# Patient Record
Sex: Male | Born: 1955 | Race: White | Hispanic: No | Marital: Married
Health system: Midwestern US, Academic
[De-identification: ages and names within clinical notes are randomized; demographics above are authoritative.]

---

## 2016-11-09 ENCOUNTER — Encounter: Admit: 2016-11-09 | Discharge: 2016-11-09 | Payer: BC Managed Care – PPO

## 2016-11-10 MED ORDER — METOPROLOL SUCCINATE 100 MG PO TB24
ORAL_TABLET | Freq: Every day | ORAL | 11 refills | 90.00000 days | Status: AC
Start: 2016-11-10 — End: 2017-11-05

## 2016-11-11 ENCOUNTER — Encounter: Admit: 2016-11-11 | Discharge: 2016-11-11 | Payer: BC Managed Care – PPO

## 2016-11-11 MED ORDER — ROSUVASTATIN 10 MG PO TAB
10 mg | ORAL_TABLET | Freq: Every day | ORAL | 1 refills | 90.00000 days | Status: AC
Start: 2016-11-11 — End: 2017-08-11

## 2016-11-11 NOTE — Progress Notes
Dear Dr. Marlyne Beards,     Delice Lesch (DOB October 22, 2055) is also followed by cardiologist, Dr. Bobette Mo.    For continuity of care please fax:    Most recent lipid and liver profile, chemistry panel, and thyroid panel.    Please fax results to:     [facility] Cardiovascular Medicine Department     [place] office fax: [phone]      Thank you,     Sue Lush, RN  Please call [phone] with any questions or concerns.

## 2016-11-11 NOTE — Telephone Encounter
Received a call from [place] at pt's pharmacy [phone]. Script was sent for Crestor 10 mg daily; however, pt reports taking it every other day and his fill history indicates that he is taking it every other day. I asked that they fill it at 10 mg daily and explained that pt will be getting labs. Once labs are received, we can review and determine appropriate dose.

## 2016-11-24 ENCOUNTER — Encounter: Admit: 2016-11-24 | Discharge: 2016-11-24 | Payer: BC Managed Care – PPO

## 2016-11-24 DIAGNOSIS — E7800 Pure hypercholesterolemia, unspecified: Principal | ICD-10-CM

## 2016-11-24 DIAGNOSIS — R002 Palpitations: ICD-10-CM

## 2017-01-07 ENCOUNTER — Encounter: Admit: 2017-01-07 | Discharge: 2017-01-07 | Payer: BC Managed Care – PPO

## 2017-01-07 MED ORDER — LOSARTAN 50 MG PO TAB
ORAL_TABLET | Freq: Every day | ORAL | 1 refills | 30.00000 days | Status: AC
Start: 2017-01-07 — End: 2017-05-13

## 2017-04-30 ENCOUNTER — Encounter: Admit: 2017-04-30 | Discharge: 2017-04-30 | Payer: BC Managed Care – PPO

## 2017-04-30 DIAGNOSIS — E7800 Pure hypercholesterolemia, unspecified: Principal | ICD-10-CM

## 2017-04-30 DIAGNOSIS — I471 Supraventricular tachycardia: ICD-10-CM

## 2017-05-08 LAB — LIPID PROFILE
Lab: 117
Lab: 166
Lab: 23
Lab: 4
Lab: 48
Lab: 95

## 2017-05-08 LAB — COMPREHENSIVE METABOLIC PANEL
Lab: 1.1 — ABNORMAL HIGH (ref 0.0–1.0)
Lab: 142
Lab: 15 — ABNORMAL HIGH (ref 0–14)
Lab: 22
Lab: 29
Lab: 3.9
Lab: 42
Lab: 6.9

## 2017-05-12 ENCOUNTER — Encounter: Admit: 2017-05-12 | Discharge: 2017-05-12 | Payer: BC Managed Care – PPO

## 2017-05-12 DIAGNOSIS — E7800 Pure hypercholesterolemia, unspecified: Principal | ICD-10-CM

## 2017-05-12 DIAGNOSIS — I471 Supraventricular tachycardia: ICD-10-CM

## 2017-05-13 ENCOUNTER — Ambulatory Visit: Admit: 2017-05-13 | Discharge: 2017-05-14 | Payer: BC Managed Care – PPO

## 2017-05-13 ENCOUNTER — Encounter: Admit: 2017-05-13 | Discharge: 2017-05-13 | Payer: BC Managed Care – PPO

## 2017-05-13 DIAGNOSIS — G4733 Obstructive sleep apnea (adult) (pediatric): ICD-10-CM

## 2017-05-13 DIAGNOSIS — I471 Supraventricular tachycardia: Principal | ICD-10-CM

## 2017-05-13 DIAGNOSIS — R739 Hyperglycemia, unspecified: ICD-10-CM

## 2017-05-13 DIAGNOSIS — E786 Lipoprotein deficiency: ICD-10-CM

## 2017-05-13 DIAGNOSIS — F410 Panic disorder [episodic paroxysmal anxiety] without agoraphobia: ICD-10-CM

## 2017-05-13 DIAGNOSIS — G4730 Sleep apnea, unspecified: ICD-10-CM

## 2017-05-13 DIAGNOSIS — R002 Palpitations: ICD-10-CM

## 2017-05-13 DIAGNOSIS — Z9109 Other allergy status, other than to drugs and biological substances: ICD-10-CM

## 2017-05-13 DIAGNOSIS — E7800 Pure hypercholesterolemia, unspecified: ICD-10-CM

## 2017-05-13 DIAGNOSIS — Z0189 Encounter for other specified special examinations: ICD-10-CM

## 2017-05-13 DIAGNOSIS — E119 Type 2 diabetes mellitus without complications: ICD-10-CM

## 2017-05-13 MED ORDER — LOSARTAN 100 MG PO TAB
100 mg | ORAL_TABLET | Freq: Every day | ORAL | 3 refills | 30.00000 days | Status: AC
Start: 2017-05-13 — End: 2018-05-27

## 2017-06-04 ENCOUNTER — Encounter: Admit: 2017-06-04 | Discharge: 2017-06-04 | Payer: BC Managed Care – PPO

## 2017-06-18 ENCOUNTER — Encounter: Admit: 2017-06-18 | Discharge: 2017-06-18 | Payer: BC Managed Care – PPO

## 2017-06-18 DIAGNOSIS — Z9109 Other allergy status, other than to drugs and biological substances: ICD-10-CM

## 2017-06-18 DIAGNOSIS — E119 Type 2 diabetes mellitus without complications: ICD-10-CM

## 2017-06-18 DIAGNOSIS — Z0189 Encounter for other specified special examinations: ICD-10-CM

## 2017-06-18 DIAGNOSIS — F410 Panic disorder [episodic paroxysmal anxiety] without agoraphobia: ICD-10-CM

## 2017-06-18 DIAGNOSIS — G4730 Sleep apnea, unspecified: ICD-10-CM

## 2017-06-18 DIAGNOSIS — R739 Hyperglycemia, unspecified: ICD-10-CM

## 2017-06-18 DIAGNOSIS — I471 Supraventricular tachycardia: Principal | ICD-10-CM

## 2017-06-18 DIAGNOSIS — R002 Palpitations: ICD-10-CM

## 2017-06-18 DIAGNOSIS — E786 Lipoprotein deficiency: ICD-10-CM

## 2017-08-11 ENCOUNTER — Encounter: Admit: 2017-08-11 | Discharge: 2017-08-11 | Payer: BC Managed Care – PPO

## 2017-08-11 MED ORDER — ROSUVASTATIN 10 MG PO TAB
10 mg | ORAL_TABLET | Freq: Every day | ORAL | 3 refills | 90.00000 days | Status: AC
Start: 2017-08-11 — End: 2018-08-31

## 2017-09-14 ENCOUNTER — Ambulatory Visit: Admit: 2017-09-14 | Discharge: 2017-09-15 | Payer: BC Managed Care – PPO

## 2017-09-14 ENCOUNTER — Encounter: Admit: 2017-09-14 | Discharge: 2017-09-14 | Payer: BC Managed Care – PPO

## 2017-09-14 DIAGNOSIS — G4730 Sleep apnea, unspecified: ICD-10-CM

## 2017-09-14 DIAGNOSIS — E7800 Pure hypercholesterolemia, unspecified: Principal | ICD-10-CM

## 2017-09-14 DIAGNOSIS — E119 Type 2 diabetes mellitus without complications: ICD-10-CM

## 2017-09-14 DIAGNOSIS — R739 Hyperglycemia, unspecified: ICD-10-CM

## 2017-09-14 DIAGNOSIS — Z0189 Encounter for other specified special examinations: ICD-10-CM

## 2017-09-14 DIAGNOSIS — I471 Supraventricular tachycardia: ICD-10-CM

## 2017-09-14 DIAGNOSIS — J0100 Acute maxillary sinusitis, unspecified: ICD-10-CM

## 2017-09-14 DIAGNOSIS — E786 Lipoprotein deficiency: ICD-10-CM

## 2017-09-14 DIAGNOSIS — R002 Palpitations: ICD-10-CM

## 2017-09-14 DIAGNOSIS — F410 Panic disorder [episodic paroxysmal anxiety] without agoraphobia: ICD-10-CM

## 2017-09-14 DIAGNOSIS — Z9109 Other allergy status, other than to drugs and biological substances: ICD-10-CM

## 2017-09-14 MED ORDER — AZITHROMYCIN 250 MG PO TAB
ORAL_TABLET | Freq: Every day | ORAL | 0 refills | Status: AC
Start: 2017-09-14 — End: ?

## 2017-09-15 ENCOUNTER — Encounter: Admit: 2017-09-15 | Discharge: 2017-09-15 | Payer: BC Managed Care – PPO

## 2017-11-05 ENCOUNTER — Encounter: Admit: 2017-11-05 | Discharge: 2017-11-05 | Payer: BC Managed Care – PPO

## 2017-11-05 MED ORDER — METOPROLOL SUCCINATE 100 MG PO TB24
ORAL_TABLET | Freq: Every day | ORAL | 11 refills | 90.00000 days | Status: AC
Start: 2017-11-05 — End: 2018-10-25

## 2018-03-30 ENCOUNTER — Encounter: Admit: 2018-03-30 | Discharge: 2018-03-30 | Payer: BC Managed Care – PPO

## 2018-03-30 DIAGNOSIS — E7800 Pure hypercholesterolemia, unspecified: Secondary | ICD-10-CM

## 2018-03-30 DIAGNOSIS — E786 Lipoprotein deficiency: Secondary | ICD-10-CM

## 2018-04-08 ENCOUNTER — Encounter: Admit: 2018-04-08 | Discharge: 2018-04-08 | Payer: BC Managed Care – PPO

## 2018-04-08 DIAGNOSIS — E786 Lipoprotein deficiency: Secondary | ICD-10-CM

## 2018-04-08 DIAGNOSIS — E7800 Pure hypercholesterolemia, unspecified: Secondary | ICD-10-CM

## 2018-04-08 LAB — CBC
Lab: 15
Lab: 31
Lab: 96
Lab: 12
Lab: 202
Lab: 30
Lab: 49
Lab: 5.1
Lab: 6.3

## 2018-04-08 LAB — LIPID PROFILE
Lab: 149 mg/dL — ABNORMAL LOW (ref 150–200)
Lab: 16 MMOL/L — ABNORMAL LOW (ref 21–30)
Lab: 3 (ref 3–12)
Lab: 53 U/L — ABNORMAL HIGH (ref 25–110)
Lab: 78 g/dL — ABNORMAL HIGH (ref 98–107)
Lab: 85 U/L (ref 7–40)

## 2018-04-08 LAB — COMPREHENSIVE METABOLIC PANEL
Lab: 1.4 mL/min — ABNORMAL HIGH (ref 0.0–1.0)
Lab: 144 g/dL — ABNORMAL LOW (ref 6.0–8.0)
Lab: 7 mL/min (ref >60)

## 2018-04-12 ENCOUNTER — Ambulatory Visit: Admit: 2018-04-12 | Discharge: 2018-04-13 | Payer: BC Managed Care – PPO

## 2018-04-12 ENCOUNTER — Encounter: Admit: 2018-04-12 | Discharge: 2018-04-12 | Payer: BC Managed Care – PPO

## 2018-04-12 DIAGNOSIS — Z9109 Other allergy status, other than to drugs and biological substances: Secondary | ICD-10-CM

## 2018-04-12 DIAGNOSIS — G4730 Sleep apnea, unspecified: Secondary | ICD-10-CM

## 2018-04-12 DIAGNOSIS — E786 Lipoprotein deficiency: Secondary | ICD-10-CM

## 2018-04-12 DIAGNOSIS — F410 Panic disorder [episodic paroxysmal anxiety] without agoraphobia: Secondary | ICD-10-CM

## 2018-04-12 DIAGNOSIS — R739 Hyperglycemia, unspecified: Secondary | ICD-10-CM

## 2018-04-12 DIAGNOSIS — I471 Supraventricular tachycardia: Secondary | ICD-10-CM

## 2018-04-12 DIAGNOSIS — E119 Type 2 diabetes mellitus without complications: Secondary | ICD-10-CM

## 2018-04-12 DIAGNOSIS — Z0189 Encounter for other specified special examinations: Secondary | ICD-10-CM

## 2018-04-12 DIAGNOSIS — E7800 Pure hypercholesterolemia, unspecified: Secondary | ICD-10-CM

## 2018-04-12 DIAGNOSIS — R002 Palpitations: Secondary | ICD-10-CM

## 2018-04-12 DIAGNOSIS — G4733 Obstructive sleep apnea (adult) (pediatric): Secondary | ICD-10-CM

## 2018-04-18 ENCOUNTER — Encounter: Admit: 2018-04-18 | Discharge: 2018-04-18 | Payer: BC Managed Care – PPO

## 2018-04-18 DIAGNOSIS — G4730 Sleep apnea, unspecified: ICD-10-CM

## 2018-04-18 DIAGNOSIS — E119 Type 2 diabetes mellitus without complications: ICD-10-CM

## 2018-04-18 DIAGNOSIS — R002 Palpitations: ICD-10-CM

## 2018-04-18 DIAGNOSIS — F410 Panic disorder [episodic paroxysmal anxiety] without agoraphobia: ICD-10-CM

## 2018-04-18 DIAGNOSIS — E786 Lipoprotein deficiency: ICD-10-CM

## 2018-04-18 DIAGNOSIS — Z0189 Encounter for other specified special examinations: ICD-10-CM

## 2018-04-18 DIAGNOSIS — I471 Supraventricular tachycardia: Principal | ICD-10-CM

## 2018-04-18 DIAGNOSIS — Z9109 Other allergy status, other than to drugs and biological substances: ICD-10-CM

## 2018-04-18 DIAGNOSIS — R739 Hyperglycemia, unspecified: ICD-10-CM

## 2018-05-27 ENCOUNTER — Encounter: Admit: 2018-05-27 | Discharge: 2018-05-27 | Payer: BC Managed Care – PPO

## 2018-05-27 MED ORDER — LOSARTAN 100 MG PO TAB
100 mg | ORAL_TABLET | Freq: Every day | ORAL | 3 refills | 90.00000 days | Status: AC
Start: 2018-05-27 — End: 2019-05-25

## 2018-08-31 ENCOUNTER — Encounter: Admit: 2018-08-31 | Discharge: 2018-08-31

## 2018-08-31 MED ORDER — ROSUVASTATIN 10 MG PO TAB
ORAL_TABLET | Freq: Every day | ORAL | 2 refills | 90.00000 days | Status: DC
Start: 2018-08-31 — End: 2019-05-25

## 2018-08-31 NOTE — Telephone Encounter
Received a request via computer from the patients pharmacy requesting a refill. Script e-scribed as requested. 

## 2018-09-02 ENCOUNTER — Encounter: Admit: 2018-09-02 | Discharge: 2018-09-02

## 2018-09-02 NOTE — Telephone Encounter
09/02/2018 11:25 AM   Call received from [facility] wanting coding for [facility] ordered 04/08/18. Code E78.00 given.  Wilder Glade, LPN

## 2018-10-08 ENCOUNTER — Encounter: Admit: 2018-10-08 | Discharge: 2018-10-08

## 2018-10-08 NOTE — Telephone Encounter
Pt called and requested to see Dr. Tollie Pizza next week.  He states that he was recently in the [facility] ED with atrial flutter.  He states that this has happened before when he has been dehydrated, but would like to see Dr. Tollie Pizza as they increased his metoprolol dosing, and he wants to get checked out.      Scheduled pt with Dr. Tollie Pizza on 7/29 in the [place] office.  Pt confirmed appointment time and location.    Will request records from [facility].

## 2018-10-08 NOTE — Progress Notes
Records Request    Medical records request for continuation of care:    Patient has appointment on 10/13/2018  with  Dr. Tollie Pizza .    Please fax records to [facility]  [phone]    Request records:      EKG's           Recent Cardiac Testing    Any cardiac-related records    Recent Labs    Recent ED visit notes    Thank you,      [facility]  [facility]  [address] [place] [address]  Phone:  [phone]  Fax:  [phone]

## 2018-10-11 ENCOUNTER — Encounter: Admit: 2018-10-11 | Discharge: 2018-10-11

## 2018-10-11 DIAGNOSIS — I4892 Unspecified atrial flutter: Secondary | ICD-10-CM

## 2018-10-11 DIAGNOSIS — R002 Palpitations: Secondary | ICD-10-CM

## 2018-10-11 DIAGNOSIS — I471 Supraventricular tachycardia: Secondary | ICD-10-CM

## 2018-10-12 ENCOUNTER — Encounter: Admit: 2018-10-12 | Discharge: 2018-10-12

## 2018-10-13 ENCOUNTER — Encounter: Admit: 2018-10-13 | Discharge: 2018-10-13

## 2018-10-13 ENCOUNTER — Ambulatory Visit: Admit: 2018-10-13 | Discharge: 2018-10-13

## 2018-10-13 DIAGNOSIS — I4892 Unspecified atrial flutter: Secondary | ICD-10-CM

## 2018-10-13 DIAGNOSIS — I471 Supraventricular tachycardia: Secondary | ICD-10-CM

## 2018-10-13 DIAGNOSIS — Z9109 Other allergy status, other than to drugs and biological substances: Secondary | ICD-10-CM

## 2018-10-13 DIAGNOSIS — R002 Palpitations: Principal | ICD-10-CM

## 2018-10-13 DIAGNOSIS — Z0189 Encounter for other specified special examinations: Secondary | ICD-10-CM

## 2018-10-13 DIAGNOSIS — F410 Panic disorder [episodic paroxysmal anxiety] without agoraphobia: Secondary | ICD-10-CM

## 2018-10-13 DIAGNOSIS — E7800 Pure hypercholesterolemia, unspecified: Secondary | ICD-10-CM

## 2018-10-13 DIAGNOSIS — G4730 Sleep apnea, unspecified: Secondary | ICD-10-CM

## 2018-10-13 DIAGNOSIS — R739 Hyperglycemia, unspecified: Secondary | ICD-10-CM

## 2018-10-13 DIAGNOSIS — E786 Lipoprotein deficiency: Secondary | ICD-10-CM

## 2018-10-13 DIAGNOSIS — E119 Type 2 diabetes mellitus without complications: Secondary | ICD-10-CM

## 2018-10-13 MED ORDER — PERFLUTREN LIPID MICROSPHERES 1.1 MG/ML IV SUSP
1-20 mL | Freq: Once | INTRAVENOUS | 0 refills | Status: CP | PRN
Start: 2018-10-13 — End: ?
  Administered 2018-10-13: 15:00:00 2 mL via INTRAVENOUS

## 2018-10-13 MED ORDER — FLECAINIDE 50 MG PO TAB
50 mg | ORAL_TABLET | Freq: Two times a day (BID) | ORAL | 3 refills | 30.00000 days | Status: DC
Start: 2018-10-13 — End: 2019-08-25

## 2018-10-13 NOTE — Progress Notes
Date of Service: 10/13/2018    Craig Krueger is a 63 y.o. male.       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  He recently experienced 2 episodes of atrial flutter treated at [facility] emergency department.    Craig Krueger has a history of AVNRT which is treated assess with ablation.  He has not had atrial flutter or fibrillation in the past.  The AVNRT occurred intermittently for years and finally failed medical therapy before.  He was always reluctant to have the ablation but was quite pleased after all of his symptoms resolved.  We now have a new problem.  On July 12 he works very hard outside during the day came in a bit dehydrated.  He had something called to drink after supper and then developed palpitations and tachycardia.  He went to the emergency room at [facility] for an EKG reveals atrial flutter with variable block.  He was given intravenous diltiazem and converted to sinus rhythm.  He was discharged without a change in medicines.  He had a repeat episode on July 23 that was identical to the first episode.  Again converted with diltiazem he was discharged home.  The ER doctor asked him to increase his metoprolol succinate from 100 mg to 150 mg daily.  He has had no recurrences.  Both episodes occurred when he had been working very hard in the heat outside, probably did not enough to drink, and then came in.  One occurred when he drank 3 beers.  I do not think that the beer is the issue, I think that this was triggered by drinking something very cold when he was dehydrated and had been very hot.     I reviewed EKGs.  Both demonstrate atrial flutter with variable block and the ventricular rate is 110 120 bpm. We obtain an echocardiogram today to ensure that been no change in his cardiac function the last 6 years.  Ejection fraction is normal, chamber sizes are normal, valves and Doppler normal, there were no wall motion maladies.  We know from previous investigation that he does not have coronary disease.    His examinations unremarkable this time.  He has not increased his dose of beta-blocker.    Impression  1.  Paroxysmal atrial flutter with rapid variable response.  He is currently on a beta-blocker for blood pressure and palpitations.  I think we would be better to treat him with an antiarrhythmic drug rather than increase beta-blockers in this situation.  He does not have coronary disease and his LV function is normal.  We will therefore start him on flecainide 50 mg twice daily.  I also instructed him that if he develops the tachycardia again he is to take a single 100 mg dose of flecainide once.  If this is not control the rhythm then he should call our office.    2.  Hypertension.  Controlled  3.  History of AVNRT corrected with ablation  4.  Possible sleep apnea  5.  History of PACs and PVCs    Plan  Start flecainide 50 g twice daily and I will follow-up with him in the office in a few months.  He was instructed in the pill in the pocket strategy.  He will take an  additional 100 mg of flecainide if he develops arrhythmia.  He does not do that more than once a day    This note was dictated using Dragon instant transcription system.  Please pardon any inadvertent errors.       Vitals:    10/13/18 1017   BP: 128/78   BP Source: Arm, Left Upper   Pulse: 65   Weight: 102.6 kg (226 lb 3.2 oz)   Height: 1.753 m (5' 9)   PainSc: Zero     Body mass index is 33.4 kg/m???.     Past Medical History  Patient Active Problem List    Diagnosis Date Noted   ??? Maxillary sinusitis 09/14/2017   ??? Hypercholesteremia 04/24/2011   ??? Elevated blood sugar level 06/13/2010   ??? Sleep apnea 04/20/2009 Mild no treatment necessary.  Sleep study - Occasional hypopneic episodes did not reach criteria for treatment. Suggested weight loss.     ??? Heart palpitations 04/20/2009   ??? SVT (supraventricular tachycardia) (HCC)      a.  09/1995 - RF ablation for AVNRT, successful.   b.  01/1999 - recurrent palpitations. Holter - rare PACs and PVCs that correlated with symptoms.          Metoprolol initiated with improved symptoms.        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  ECG - compatible with AVNRT   Improved with increase in beta blocker - also helps panic attacks.   c.  08/2014 RF ablation for AVNRT - Dr Craig Krueger successful      ??? Hypolipoproteinemia      A.  Pravastatin and lipitor caused myalgias, tolerated Crestor.      ??? Tests ordered      1997 - exercise echo negative.   04/08 - exercise echo. 11 minutes treadmill. EKG normal. Echo normal.   02/11 exercise Echo normal.  Ef normal, valves normal. Excellent ex tolerance.  No arrhythmias  2014 regaden thallium, EF 88%, LVEDP 42 mL, no ischemia.  02/16 exercise echo.  No ischemia, EF 65%, valves and Doppler normal.  09/18 Regaden thallium: EF 23%, LV 43 mL, no ischemia, no change from 2014     ??? Environmental allergies      A, Mold allergies.     ??? Panic attacks          Review of Systems   Constitution: Negative.   HENT: Negative.    Eyes: Negative.    Cardiovascular: Negative.    Respiratory: Negative.    Endocrine: Negative.    Hematologic/Lymphatic: Negative.    Skin: Negative.    Gastrointestinal: Negative.    Genitourinary: Negative.    Neurological: Negative.    Psychiatric/Behavioral: Negative.    Allergic/Immunologic: Negative.        Physical Exam   General Appearance:???no acute distress  Skin:???warm, moist, no ulcers  HEENT:???unremarkable  Neck Veins:???neck veins are flat, neck veins are not distended  Carotid Arteries:???normal carotid upstroke bilaterally, no bruits  Chest Inspection:???chest is normal in appearance Auscultation/Percussion:???lungs clear to auscultation, no rales, rhonchi, or wheezing  Cardiac Rhythm:???regular rhythm and normal rate  Cardiac Auscultation:???Normal S1 &???S2, no S3 or S4, no rub  Murmurs:???no cardiac murmurs   Extremities:???no lower extremity edema; 2+ symmetric distal pulses  Abdominal Exam:???soft, non-tender, no masses, bowel sounds normal  Liver & Spleen:???no organomegaly  Neurologic Exam:???neurological assessment grossly intact      Current Medications (including today's revisions)  ??? allopurinoL (ZYLOPRIM) 100 mg tablet Take 300 mg by  mouth daily.   ??? ALPRAZolam (XANAX) 0.5 mg tablet Take 0.5 mg by mouth three times daily as needed.   ??? FEXOFENADINE HCL (ALLEGRA PO) Take 1 Tab by mouth daily.   ??? flecainide (TAMBOCOR) 50 mg tablet Take one tablet by mouth twice daily. Indications: sudden recurrence/intensity of rapid regular heartbeats   ??? losartan (COZAAR) 100 mg tablet Take one tablet by mouth daily.   ??? metoprolol XL (TOPROL XL) 100 mg extended release tablet TAKE 1 TABLET BY MOUTH ONCE DAILY (Patient taking differently: 150 mg daily. Instructed to take 150mg  per last hospital visit.)   ??? rosuvastatin (CRESTOR) 10 mg tablet TAKE 1 TABLET BY MOUTH EVERY DAY (Patient taking differently: Take 10 mg by mouth every 48 hours.)

## 2018-10-14 ENCOUNTER — Encounter: Admit: 2018-10-14 | Discharge: 2018-10-14

## 2018-10-14 DIAGNOSIS — G4730 Sleep apnea, unspecified: Secondary | ICD-10-CM

## 2018-10-14 DIAGNOSIS — Z0189 Encounter for other specified special examinations: Secondary | ICD-10-CM

## 2018-10-14 DIAGNOSIS — E119 Type 2 diabetes mellitus without complications: Secondary | ICD-10-CM

## 2018-10-14 DIAGNOSIS — E786 Lipoprotein deficiency: Secondary | ICD-10-CM

## 2018-10-14 DIAGNOSIS — I471 Supraventricular tachycardia: Secondary | ICD-10-CM

## 2018-10-14 DIAGNOSIS — R739 Hyperglycemia, unspecified: Secondary | ICD-10-CM

## 2018-10-14 DIAGNOSIS — R002 Palpitations: Secondary | ICD-10-CM

## 2018-10-14 DIAGNOSIS — Z9109 Other allergy status, other than to drugs and biological substances: Secondary | ICD-10-CM

## 2018-10-14 DIAGNOSIS — F410 Panic disorder [episodic paroxysmal anxiety] without agoraphobia: Secondary | ICD-10-CM

## 2018-10-23 ENCOUNTER — Encounter: Admit: 2018-10-23 | Discharge: 2018-10-23

## 2018-10-25 MED ORDER — METOPROLOL SUCCINATE 100 MG PO TB24
ORAL_TABLET | Freq: Every day | ORAL | 10 refills | 90.00000 days | Status: DC
Start: 2018-10-25 — End: 2019-05-06

## 2018-12-20 NOTE — Progress Notes
The healthcare system obtained patient's verbal consent to treat them and their agreement to [facility] financial policy and NPP via this telehealth visit during the [facility] Emergency    Date of Service: 12/20/2018    Craig Krueger is a 63 y.o. male.       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    In 63 year old 2 episodes of atrial flutter.  He would like to proceed with ablation immediately.  We placed him on flecainide.  Since his rhythm has been controlled.    The patient was seen as a telehealth/telephone visit to the COVID-19 virus crisis.  During the pandemic we are attempting to decrease patient exposure by having tele-visits or telephone rather than in office visits with exposure to waiting room and having to leave the home.  We discussed  the COVID-19 virus pandemic precautions including social distancing, staying at home, avoiding contact with people when you have to go out, and other topics.    Craig Krueger is recently started on flecainide for a recurrence of arrhythmias.  He has history of AVNRT which was treated with ablation and is always had PACs and PVCs causing palpitations.  Recently he had a couple of episodes of atrial flutter.  We will start him on flecainide and with this his palpitations have completely resolved.  The longer episodes where he felt like his heart was racing have resolved.  His EKG does not show evidence of QT or icterus prolongation on the flecainide.  For now are going to continue with this dose.  We started 50 mg twice daily but is a pretty big man.  If he has a breakthrough is to take 100 mg as a pill in pocket strategy.  Today he is doing quite well.  He had no palpitations dizziness or lightheadedness.  He is working hard again.  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  He still has the occasional irregular beats.  Totaling these would not likely change.  He does not have shortness of breath or chest pain.    No examination due to telephone visit    Impression  1.  Paroxysmal atrial flutter.  Controlled with flecainide and beta-blocker  2.  Hypertension controlled  3.  History of AVNRT corrected with ablation  4.  Possible sleep apnea  5.  PACs and PVCs are benign  6.  No underlying coronary disease  7.  Hypercholesterolemia treated    We will continue with current therapy.  Plan on following up with him in 4 to 5 months at her previously scheduled appointment.       Total time 11 minutes.  Estimated counseling time 10 minutes.       Vitals:    12/20/18 1458   BP: 128/84   BP Source: Arm, Left Upper   Pulse: 76   Weight: 102.1 kg (225 lb)   Height: 1.753 m (5' 9)     Body mass index is 33.23 kg/m?Marland Kitchen     Past Medical History  Patient Active Problem List    Diagnosis Date Noted   ? Atrial flutter (HCC) 10/14/2018   ? Maxillary sinusitis 09/14/2017   ? Hypercholesteremia 04/24/2011   ? Elevated blood sugar level 06/13/2010   ? Sleep apnea 04/20/2009  Mild no treatment necessary.  Sleep study - Occasional hypopneic episodes did not reach criteria for treatment. Suggested weight loss.     ? Heart palpitations 04/20/2009   ? SVT (supraventricular tachycardia) (HCC)      a.  09/1995 - RF ablation for AVNRT, successful.   b.  01/1999 - recurrent palpitations. Holter - rare PACs and PVCs that correlated with symptoms.          Metoprolol initiated with improved symptoms.        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  ECG - compatible with AVNRT   Improved with increase in beta blocker - also helps panic attacks.   c.  08/2014 RF ablation for AVNRT - Dr Betti Cruz successful      ? Hypolipoproteinemia      A.  Pravastatin and lipitor caused myalgias, tolerated Crestor. ? Tests ordered      1997 - exercise echo negative.   04/08 - exercise echo. 11 minutes treadmill. EKG normal. Echo normal.   02/11 exercise Echo normal.  Ef normal, valves normal. Excellent ex tolerance.  No arrhythmias  2014 regaden thallium, EF 88%, LVEDP 42 mL, no ischemia.  02/16 exercise echo.  No ischemia, EF 65%, valves and Doppler normal.  09/18 Regaden thallium: EF 23%, LV 43 mL, no ischemia, no change from 2014     ? Environmental allergies      A, Mold allergies.     ? Panic attacks          Review of Systems   Constitution: Negative.   HENT: Negative.    Eyes: Negative.    Cardiovascular: Negative.    Respiratory: Negative.    Endocrine: Negative.    Hematologic/Lymphatic: Negative.    Skin: Negative.    Musculoskeletal: Positive for arthritis and back pain.   Gastrointestinal: Negative.    Genitourinary: Negative.    Neurological: Negative.    Psychiatric/Behavioral: Negative.    Allergic/Immunologic: Negative.        Physical Exam  Telephone visit.  No examination.      Current Medications (including today's revisions)  ? allopurinoL (ZYLOPRIM) 100 mg tablet Take 300 mg by mouth daily.   ? ALPRAZolam (XANAX) 0.5 mg tablet Take 0.5 mg by mouth three times daily as needed.   ? FEXOFENADINE HCL (ALLEGRA PO) Take 1 Tab by mouth daily.   ? flecainide (TAMBOCOR) 50 mg tablet Take one tablet by mouth twice daily. Indications: sudden recurrence/intensity of rapid regular heartbeats   ? losartan (COZAAR) 100 mg tablet Take one tablet by mouth daily.   ? metoprolol XL (TOPROL XL) 100 mg extended release tablet TAKE 1 TABLET BY MOUTH EVERY DAY   ? rosuvastatin (CRESTOR) 10 mg tablet TAKE 1 TABLET BY MOUTH EVERY DAY (Patient taking differently: Take 10 mg by mouth every 48 hours.)

## 2019-04-06 ENCOUNTER — Encounter

## 2019-04-06 DIAGNOSIS — E7800 Pure hypercholesterolemia, unspecified: Secondary | ICD-10-CM

## 2019-04-06 DIAGNOSIS — R002 Palpitations: Secondary | ICD-10-CM

## 2019-04-26 ENCOUNTER — Encounter: Admit: 2019-04-26 | Discharge: 2019-04-26 | Payer: BC Managed Care – PPO

## 2019-04-26 DIAGNOSIS — E7800 Pure hypercholesterolemia, unspecified: Secondary | ICD-10-CM

## 2019-04-26 DIAGNOSIS — R002 Palpitations: Secondary | ICD-10-CM

## 2019-04-26 LAB — LIPID PROFILE
Lab: 144
Lab: 17
Lab: 3
Lab: 76
Lab: 83
Lab: 51

## 2019-05-06 ENCOUNTER — Encounter: Admit: 2019-05-06 | Discharge: 2019-05-06 | Payer: BC Managed Care – PPO

## 2019-05-06 DIAGNOSIS — I10 Essential (primary) hypertension: Secondary | ICD-10-CM

## 2019-05-06 DIAGNOSIS — I4892 Unspecified atrial flutter: Secondary | ICD-10-CM

## 2019-05-06 DIAGNOSIS — I471 Supraventricular tachycardia: Secondary | ICD-10-CM

## 2019-05-06 DIAGNOSIS — E786 Lipoprotein deficiency: Secondary | ICD-10-CM

## 2019-05-06 DIAGNOSIS — E7800 Pure hypercholesterolemia, unspecified: Secondary | ICD-10-CM

## 2019-05-06 DIAGNOSIS — R002 Palpitations: Secondary | ICD-10-CM

## 2019-05-06 DIAGNOSIS — E119 Type 2 diabetes mellitus without complications: Secondary | ICD-10-CM

## 2019-05-06 DIAGNOSIS — R739 Hyperglycemia, unspecified: Secondary | ICD-10-CM

## 2019-05-06 DIAGNOSIS — F410 Panic disorder [episodic paroxysmal anxiety] without agoraphobia: Secondary | ICD-10-CM

## 2019-05-06 DIAGNOSIS — Z9109 Other allergy status, other than to drugs and biological substances: Secondary | ICD-10-CM

## 2019-05-06 DIAGNOSIS — Z0189 Encounter for other specified special examinations: Secondary | ICD-10-CM

## 2019-05-06 DIAGNOSIS — G4730 Sleep apnea, unspecified: Secondary | ICD-10-CM

## 2019-05-06 MED ORDER — METOPROLOL SUCCINATE 100 MG PO TB24
150 mg | ORAL_TABLET | Freq: Every day | ORAL | 3 refills | 90.00000 days | Status: AC
Start: 2019-05-06 — End: ?

## 2019-05-06 NOTE — Progress Notes
Date of Service: 05/06/2019    Craig Krueger is a 64 y.o. male.       HPI     Mr. Craig Krueger was seen in our office today for routine follow-up.  He is a 64 year old male followed in our office by Dr. Bobette Mo.  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  In 2020 a Holter showed two episodes of atrial flutter.  He was placed on flecainide.  Since, his rhythm has been controlled.  His last echo obtained 10/13/2018 showed normal left ventricular size and systolic function, EF 55 to 60%, normal RV size and systolic function, no significant valve disease.    The patient was last evaluated on 12/20/2018 by Dr. Doristine Counter with a scheduled telephone visit due to COVID-19 pandemic.  No changes were made in his medical therapy, continued on flecainide 50 mg twice daily.  Plans were made for him to follow back up in the office in 4 to 5 months.    Craig Krueger recently started flecainide for recurrence of arrhythmia.  He is always had PACs and PVCs causing palpitations.    Today the patient reports occasional skipped heartbeats occurring about 10 times a day most days occurring 1 beat at a time, sometimes 3 or 4 beats in a row but no sustained palpitations or racing heartbeats, these are not bothersome to him.  With the addition of flecainide his palpitations have improved significantly and his tachycardia has resolved.The longer episodes where he felt like his heart was racing have resolved.  His EKG today does not show evidence of QT or QTC prolongation on the flecainide.  He is doing very well.  He does not feel any decrease in energy or activity tolerance since last visit.  He has had no dizziness or lightheadedness.  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  He denies having chest pain, exertional chest pain, shortness of breath, exertional dyspnea, PND, orthopnea and lower extremity edema.  He denies myalgias.  He did not bring any blood pressure readings in today but tells me his morning readings are usually around 142 over 90s and lower in the evenings around 117/78.  In our office today his blood pressure is 130/88 and 134/96 in the right and left arms respectively, pulse 75 bpm.  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 We discussed  the COVID-19 virus pandemic precautions including social distancing, staying at home, avoiding contact with people when you have to go out, and other related topics.  He has had his first COVID-19 vaccine.    Cardiovascular Studies  12 lead EKG today preliminary interpretation shows normal sinus rhythm at 70 bpm, borderline left axis deviation.  PR interval 200 ms, QRS 88 ms, QT 392 ms, QTC 423 ms.  In comparison to the prior EKG dated 04/12/2018 there are no significant interval changes seen.    Assessment and Plan:  1.  Paroxysmal atrial flutter.  Normal ejection fraction.  No recurrence of palpitations or arrhythmias on the flecainide.  Going to continue with current dose of flecainide 50 mg twice daily.  If he has breakthrough he is to take an 50 mg pill in the pocket strategy.  2.  Hypertension.   Blood  pressure is borderline and in our office today.  Home readings do not appear to be fully controlled.  He reports home morning readings in the 140s systolic.  3.  History of AVNRT treated with successful ablation.  4.  Possible sleep apnea.  5.  PACs and PVCs that are benign.  6.  No underlying coronary artery disease.  7.  Hypercholesterolemia, treated.  Recent lipid profile on 04/26/2019 shows excellent control on rosuvastatin 10 mg daily with total cholesterol 144, triglycerides 83, LDL 76.    -Increase metoprolol XL to 150 mg daily, take at bedtime.  ?If patient has recurrent tachycardia he may take an extra dose of flecainide pill in the pocket 50 mg.  -If he does have recurrent tachycardia has to take an extra dose of flecainide he is instructed to contact our office to let us know.  -Asked patient to check his blood pressure twice daily and contact our office if he is often having systolic readings above 140 or diastolic above 90.  ?Reviewed and discussed blood pressure goals, target less than 135/85.  ?Instructed to call our office in 2 to 3 weeks with an update on his blood pressure readings.  -Discussed importance of a healthy lifestyle including low-fat, low triglyceride/low carbohydrate/low sugar, low-sodium diet and regular exercise.  -Encouraged to start a regular walking program, try to get in 30 minutes of exercise 5 days a week.  ?Discussed importance of continuing with COVID-19 pandemic precautions.  He will be getting his second vaccine soon.    Follow-up: Follow-up with Dr. Doristine Counter in 1 year, sooner if needed.  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  Patient is encouraged to contact our office if he has problems prior to next visit    Patient was educated on the plan of care.  Patient verbally expressed understanding and agreement with plan.  Specific instructions are outlined in the after visit summary document.     Thank you for the opportunity to participate in the care of your patient.  If you have any questions concern please do not sedate to contact us.        DISEASE PREVENTION  Lipids: Lipid profile 04/25/2018: Total cholesterol 144, triglycerides 83, HDL 51, LDL 76. Patient does not have  known CAD or diabetes. Goal LDL < 100, triglycerides < 150.      HTN: Blood pressure today 130/88.     Diabetes: No.      Tobacco: Denies use.      Obesity: Body mass index is 34.5.  Encouraged patient to work on reduction through diet and regular exercise.     Education/counseling today: Medication instructions, reviewed lab results, diet, exercise, blood pressure monitoring, blood pressure goals,follow up plan.            DRB  Vitals:    05/06/19 1415 05/06/19 1426   BP: (!) 134/96 130/88   BP Source: Arm, Left Upper Arm, Right Upper   Patient Position: Sitting Sitting   Pulse: 75    SpO2: 97%    Weight: 106.1 kg (234 lb)    Height: 1.753 m (5' 9)    PainSc: Zero      Body mass index is 34.56 kg/m?Marland Kitchen     Past Medical History  Patient Active Problem List    Diagnosis Date Noted   ? Essential hypertension 05/06/2019   ? Atrial flutter (HCC) 10/14/2018   ? Maxillary sinusitis 09/14/2017   ?  Hypercholesteremia 04/24/2011   ? Elevated blood sugar level 06/13/2010   ? Sleep apnea 04/20/2009     Mild no treatment necessary.  Sleep study - Occasional hypopneic episodes did not reach criteria for treatment. Suggested weight loss.     ? Heart palpitations 04/20/2009   ? SVT (supraventricular tachycardia) (HCC)      a.  09/1995 - RF ablation for AVNRT, successful.   b.  01/1999 - recurrent palpitations. Holter - rare PACs and PVCs that correlated with symptoms.          Metoprolol initiated with improved symptoms.        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  ECG - compatible with AVNRT   Improved with increase in beta blocker - also helps panic attacks.   c.  08/2014 RF ablation for AVNRT - Dr Betti Cruz successful      ? Hypolipoproteinemia      A.  Pravastatin and lipitor caused myalgias, tolerated Crestor.      ? Tests ordered      1997 - exercise echo negative.   04/08 - exercise echo. 11 minutes treadmill. EKG normal. Echo normal.   02/11 exercise Echo normal.  Ef normal, valves normal. Excellent ex tolerance.  No arrhythmias  2014 regaden thallium, EF 88%, LVEDP 42 mL, no ischemia.  02/16 exercise echo.  No ischemia, EF 65%, valves and Doppler normal.  09/18 Regaden thallium: EF 23%, LV 43 mL, no ischemia, no change from 2014     ? Environmental allergies      A, Mold allergies.     ? Panic attacks          Review of Systems   Constitution: Negative. HENT: Negative.    Eyes: Negative.    Cardiovascular: Positive for irregular heartbeat and palpitations.   Respiratory: Negative.    Endocrine: Negative.    Hematologic/Lymphatic: Negative.    Skin: Negative.    Musculoskeletal: Positive for stiffness.   Gastrointestinal: Negative.    Genitourinary: Negative.    Neurological: Negative.    Psychiatric/Behavioral: Negative.    Allergic/Immunologic: Negative.        Physical Exam  Vital signs were reviewed.   General Appearance:appears well nourished, obese, appears relaxed, in no acute distress,wearing a face mask  Skin: warm, moist, intact, no rash or lesions  HEENT: unremarkable, pupils equal and round, no scleral icterus  Lips & Mouth: no pallor or cyanosis  Neck Veins: neck veins are flat, neck veins are not distended   Carotid Arteries: normal carotid upstroke bilaterally, no bruits bilaterally  Chest Inspection: chest is normal in appearance  Auscultation/Percussion/Effort: lungs clear to auscultation, no rales, rhonchi, or wheezing, respirations even and unlabored, no respiratory distress  Cardiac Rhythm: regular rhythm and normal rate   Cardiac Auscultation: normal S1 & S2, no S3 or S4, no rub   Murmurs: no cardiac murmurs   Extremities: trace lower extremity edema bilaterally with sock top line, 2+ symmetric distal pulses   Abdominal Exam: soft, non-tender,non-distended, no obvious masses, bowel sounds normal, no guarding  Liver & Spleen: no organomegaly   Neurologic Exam: grossly intact, alert, moves all extremities equally   Orientation: oriented to time, person and place, clear historian  Gait: normal, steady, walks without assistance  Language & Memory: speech clear, patient responsive, seems to comprehend information              Problems Addressed Today  Encounter Diagnoses   Name Primary?   ? Atrial flutter, unspecified  type [facility]) Yes   ? Essential hypertension    ? Heart palpitations    ? Hypercholesteremia                    Current Medications (including today's revisions)  ? allopurinoL (ZYLOPRIM) 100 mg tablet Take 300 mg by mouth daily.   ? ALPRAZolam (XANAX) 0.5 mg tablet Take 0.5 mg by mouth three times daily as needed.   ? FEXOFENADINE HCL (ALLEGRA PO) Take 1 Tab by mouth daily.   ? flecainide (TAMBOCOR) 50 mg tablet Take one tablet by mouth twice daily. Indications: sudden recurrence/intensity of rapid regular heartbeats   ? losartan (COZAAR) 100 mg tablet Take one tablet by mouth daily.   ? metoprolol XL (TOPROL XL) 100 mg extended release tablet Take 1.5 tablets by mouth daily. Take at bedtime.   ? rosuvastatin (CRESTOR) 10 mg tablet TAKE 1 TABLET BY MOUTH EVERY DAY   ? sulindac (CLINORIL) 150 mg tablet Take 150 mg by mouth as Needed.

## 2019-05-25 ENCOUNTER — Encounter: Admit: 2019-05-25 | Discharge: 2019-05-25 | Payer: BC Managed Care – PPO

## 2019-05-25 MED ORDER — ROSUVASTATIN 10 MG PO TAB
ORAL_TABLET | Freq: Every day | ORAL | 3 refills | 90.00000 days | Status: AC
Start: 2019-05-25 — End: ?

## 2019-05-25 MED ORDER — LOSARTAN 100 MG PO TAB
100 mg | ORAL_TABLET | Freq: Every day | ORAL | 3 refills | 30.00000 days | Status: AC
Start: 2019-05-25 — End: ?

## 2019-08-25 ENCOUNTER — Encounter: Admit: 2019-08-25 | Discharge: 2019-08-25 | Payer: MEDICARE

## 2019-08-25 MED ORDER — FLECAINIDE 50 MG PO TAB
ORAL_TABLET | Freq: Two times a day (BID) | ORAL | 2 refills | 30.00000 days | Status: AC
Start: 2019-08-25 — End: ?

## 2020-04-03 ENCOUNTER — Encounter: Admit: 2020-04-03 | Discharge: 2020-04-03 | Payer: MEDICARE

## 2020-04-03 DIAGNOSIS — E7800 Pure hypercholesterolemia, unspecified: Secondary | ICD-10-CM

## 2020-04-03 LAB — COMPREHENSIVE METABOLIC PANEL
Lab: 1.1
Lab: 13
Lab: 141
Lab: 18
Lab: 21
Lab: 25
Lab: 4
Lab: 47
Lab: 6.9

## 2020-04-03 LAB — LIPID PROFILE
Lab: 114 — ABNORMAL HIGH (ref 98–107)
Lab: 153
Lab: 23
Lab: 3
Lab: 44

## 2020-04-12 ENCOUNTER — Encounter: Admit: 2020-04-12 | Discharge: 2020-04-12 | Payer: MEDICARE

## 2020-04-12 ENCOUNTER — Ambulatory Visit: Admit: 2020-04-12 | Discharge: 2020-04-12 | Payer: MEDICARE

## 2020-04-12 DIAGNOSIS — E119 Type 2 diabetes mellitus without complications: Secondary | ICD-10-CM

## 2020-04-12 DIAGNOSIS — E786 Lipoprotein deficiency: Secondary | ICD-10-CM

## 2020-04-12 DIAGNOSIS — F410 Panic disorder [episodic paroxysmal anxiety] without agoraphobia: Secondary | ICD-10-CM

## 2020-04-12 DIAGNOSIS — R002 Palpitations: Secondary | ICD-10-CM

## 2020-04-12 DIAGNOSIS — I10 Essential (primary) hypertension: Secondary | ICD-10-CM

## 2020-04-12 DIAGNOSIS — I471 Supraventricular tachycardia: Secondary | ICD-10-CM

## 2020-04-12 DIAGNOSIS — Z9109 Other allergy status, other than to drugs and biological substances: Secondary | ICD-10-CM

## 2020-04-12 DIAGNOSIS — I4892 Unspecified atrial flutter: Secondary | ICD-10-CM

## 2020-04-12 DIAGNOSIS — E7800 Pure hypercholesterolemia, unspecified: Secondary | ICD-10-CM

## 2020-04-12 DIAGNOSIS — G4730 Sleep apnea, unspecified: Secondary | ICD-10-CM

## 2020-04-12 DIAGNOSIS — Z0189 Encounter for other specified special examinations: Secondary | ICD-10-CM

## 2020-04-12 DIAGNOSIS — R739 Hyperglycemia, unspecified: Secondary | ICD-10-CM

## 2020-04-12 DIAGNOSIS — G4733 Obstructive sleep apnea (adult) (pediatric): Secondary | ICD-10-CM

## 2020-04-12 NOTE — Progress Notes
The Healthcare system obtained patient's verbal consent to treat them and their agreement to [facility] financial policy and NPP via this telehealth visit during the [facility]      Date of Service: 04/12/2020    Craig Krueger is a 65 y.o. male.       HPI     Craig Krueger is a delightful 65 year old gentleman followed through this office for AV node reentrant tachycardia with successful ablation in 2016, hypercholesterolemia, statin-induced myalgias, hypertension, and occasional palpitations due to benign PACs and PVCs which are rare.??  In 2020 he had 2 episodes of atrial flutter which have been controlled with flecainide.  He rarely uses a single dose additional pill for palpitations.       Craig Krueger is evaluated as a telephone call for routine follow-up.  He cannot get into the office closed him in a timely fashion so we use a telephone visit today.  He has no symptoms.  Still having chest pain, exertional chest pain, shortness of breath, exertional breath, PND orthopnea.  He has atrial flutter which is been treated medically with flecainide.  He had one breakthrough event with palpitations at night in November.  Per my previous instructions he took a single extra dose of flecainide and the rhythm stopped within 30 minutes.  Since it has been entirely free of palpitations or arrhythmia.  Is not had other symptoms.  Denies dizziness lightheadedness or near syncope.  We will continue with 50 mg twice daily flecainide and an extra 50 mg is a some pill in pocket strategy for breakthroughs.  As long as they happen less than once a year, he is well controlled.    He had questions about Covid, treatments for Covid, preventive treatments and the vaccine.  Unfortunately his family recently had Covid however they are all vaccinated and the illnesses are very minor.  He was able to keep working through it after the second day.    Telephone visit so no examination  ?  Impression  1.  Paroxysmal atrial flutter.  Controlled with flecainide and beta-blocker  2.  Hypertension controlled  3.  History of AVNRT corrected with ablation  4.  Possible sleep apnea  5.  PACs and PVCs are benign  6.  No underlying coronary disease  7.  Hypercholesterolemia treated    No change in therapy.  Follow-up annually now.        Total time 33 minutes.  Estimated counseling time 21 minutes.      Telehealth Patient Reported Vitals     Row Name 04/12/20 1132                BP: 130/82        BP Source: Arm, Left Upper        BP Position: Sitting        Pulse: 72        Weight: 104.3 kg (230 lb)        Height: 1.765 m (5' 9.5)        Pain Score: Four        Pain Location: KNEE                      Past Medical History  Patient Active Problem List    Diagnosis Date Noted   ? Essential hypertension 05/06/2019   ? Atrial flutter (HCC) 10/14/2018   ? Maxillary sinusitis 09/14/2017   ? Hypercholesteremia 04/24/2011   ? Elevated blood  sugar level 06/13/2010   ? Sleep apnea 04/20/2009     Mild no treatment necessary.  Sleep study - Occasional hypopneic episodes did not reach criteria for treatment. Suggested weight loss.     ? Heart palpitations 04/20/2009   ? SVT (supraventricular tachycardia) (HCC)      a.  09/1995 - RF ablation for AVNRT, successful.   b.  01/1999 - recurrent palpitations. Holter - rare PACs and PVCs that correlated with symptoms.          Metoprolol initiated with improved symptoms.        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  ECG - compatible with AVNRT   Improved with increase in beta blocker - also helps panic attacks.   c.  08/2014 RF ablation for AVNRT - Dr Betti Cruz successful      ? Hypolipoproteinemia      A.  Pravastatin and lipitor caused myalgias, tolerated Crestor.      ? Tests ordered      1997 - exercise echo negative.   04/08 - exercise echo. 11 minutes treadmill. EKG normal. Echo normal.   02/11 exercise Echo normal.  Ef normal, valves normal. Excellent ex tolerance.  No arrhythmias  2014 regaden thallium, EF 88%, LVEDP 42 mL, no ischemia.  02/16 exercise echo.  No ischemia, EF 65%, valves and Doppler normal.  09/18 Regaden thallium: EF 23%, LV 43 mL, no ischemia, no change from 2014     ? Environmental allergies      A, Mold allergies.     ? Panic attacks          Review of Systems   Constitutional: Negative.   HENT: Negative.    Eyes: Negative.    Cardiovascular: Negative.    Respiratory: Negative.    Endocrine: Negative.    Hematologic/Lymphatic: Negative.    Skin: Negative.    Musculoskeletal: Negative.    Gastrointestinal: Negative.    Genitourinary: Negative.    Neurological: Negative.    Psychiatric/Behavioral: Negative.    Allergic/Immunologic: Negative.        Physical Exam        Cardiovascular Health Factors  Vitals BP Readings from Last 3 Encounters:   05/06/19 130/88   12/20/18 128/84   10/13/18 (!) 154/88     Wt Readings from Last 3 Encounters:   05/06/19 106.1 kg (234 lb)   12/20/18 102.1 kg (225 lb)   10/13/18 102.6 kg (226 lb 3.2 oz)     BMI Readings from Last 3 Encounters:   05/06/19 34.56 kg/m?   12/20/18 33.23 kg/m?   10/13/18 33.40 kg/m?      Smoking Social History     Tobacco Use   Smoking Status Never Smoker   Smokeless Tobacco Current User   ? Types: Chew   Tobacco Comment    1/2 can/day      Lipid Profile Cholesterol   Date Value Ref Range Status   04/03/2020 153  Final     HDL   Date Value Ref Range Status   04/03/2020 44  Final     LDL   Date Value Ref Range Status   04/03/2020 86  Final     Triglycerides   Date Value Ref Range Status   04/03/2020 114  Final      Blood Sugar Hemoglobin A1C   Date Value Ref Range Status   05/02/2011 5.9  Final     Glucose   Date Value Ref Range Status   04/03/2020  109 (H) 70 - 105 Final   10/07/2018 99  Final   04/08/2018 110 (H) 70 - 105 Final   10/15/2005 90 70 - 110 MG/DL Final            Current Medications (including today's revisions)  ? allopurinoL (ZYLOPRIM) 100 mg tablet Take 300 mg by mouth daily.   ? ALPRAZolam (XANAX) 0.5 mg tablet Take 0.5 mg by mouth three times daily as needed.   ? FEXOFENADINE HCL (ALLEGRA PO) Take 1 Tab by mouth daily.   ? flecainide (TAMBOCOR) 50 mg tablet TAKE ONE TABLET BY MOUTH TWICE DAILY   ? losartan (COZAAR) 100 mg tablet TAKE ONE TABLET BY MOUTH DAILY.   ? metoprolol XL (TOPROL XL) 100 mg extended release tablet Take 1.5 tablets by mouth daily. Take at bedtime.   ? rosuvastatin (CRESTOR) 10 mg tablet TAKE 1 TABLET BY MOUTH EVERY DAY   ? sulindac (CLINORIL) 150 mg tablet Take 150 mg by mouth as Needed.

## 2020-04-18 ENCOUNTER — Encounter: Admit: 2020-04-18 | Discharge: 2020-04-18 | Payer: MEDICARE

## 2020-04-18 DIAGNOSIS — I10 Essential (primary) hypertension: Secondary | ICD-10-CM

## 2020-04-18 DIAGNOSIS — I4892 Unspecified atrial flutter: Secondary | ICD-10-CM

## 2020-04-18 MED ORDER — METOPROLOL SUCCINATE 100 MG PO TB24
150 mg | ORAL_TABLET | Freq: Every day | ORAL | 3 refills | 90.00000 days | Status: AC
Start: 2020-04-18 — End: ?

## 2020-05-04 IMAGING — CR CHEST
1 series · 1 of 1 positions shown · non-contrast
Comparison: none

PROCEDURE: CHEST
HISTORY: pt c/o palpitations - hx prev ablation tx tachycardia. hx htn - no prev CXR - Ak

[chest port x-wise]
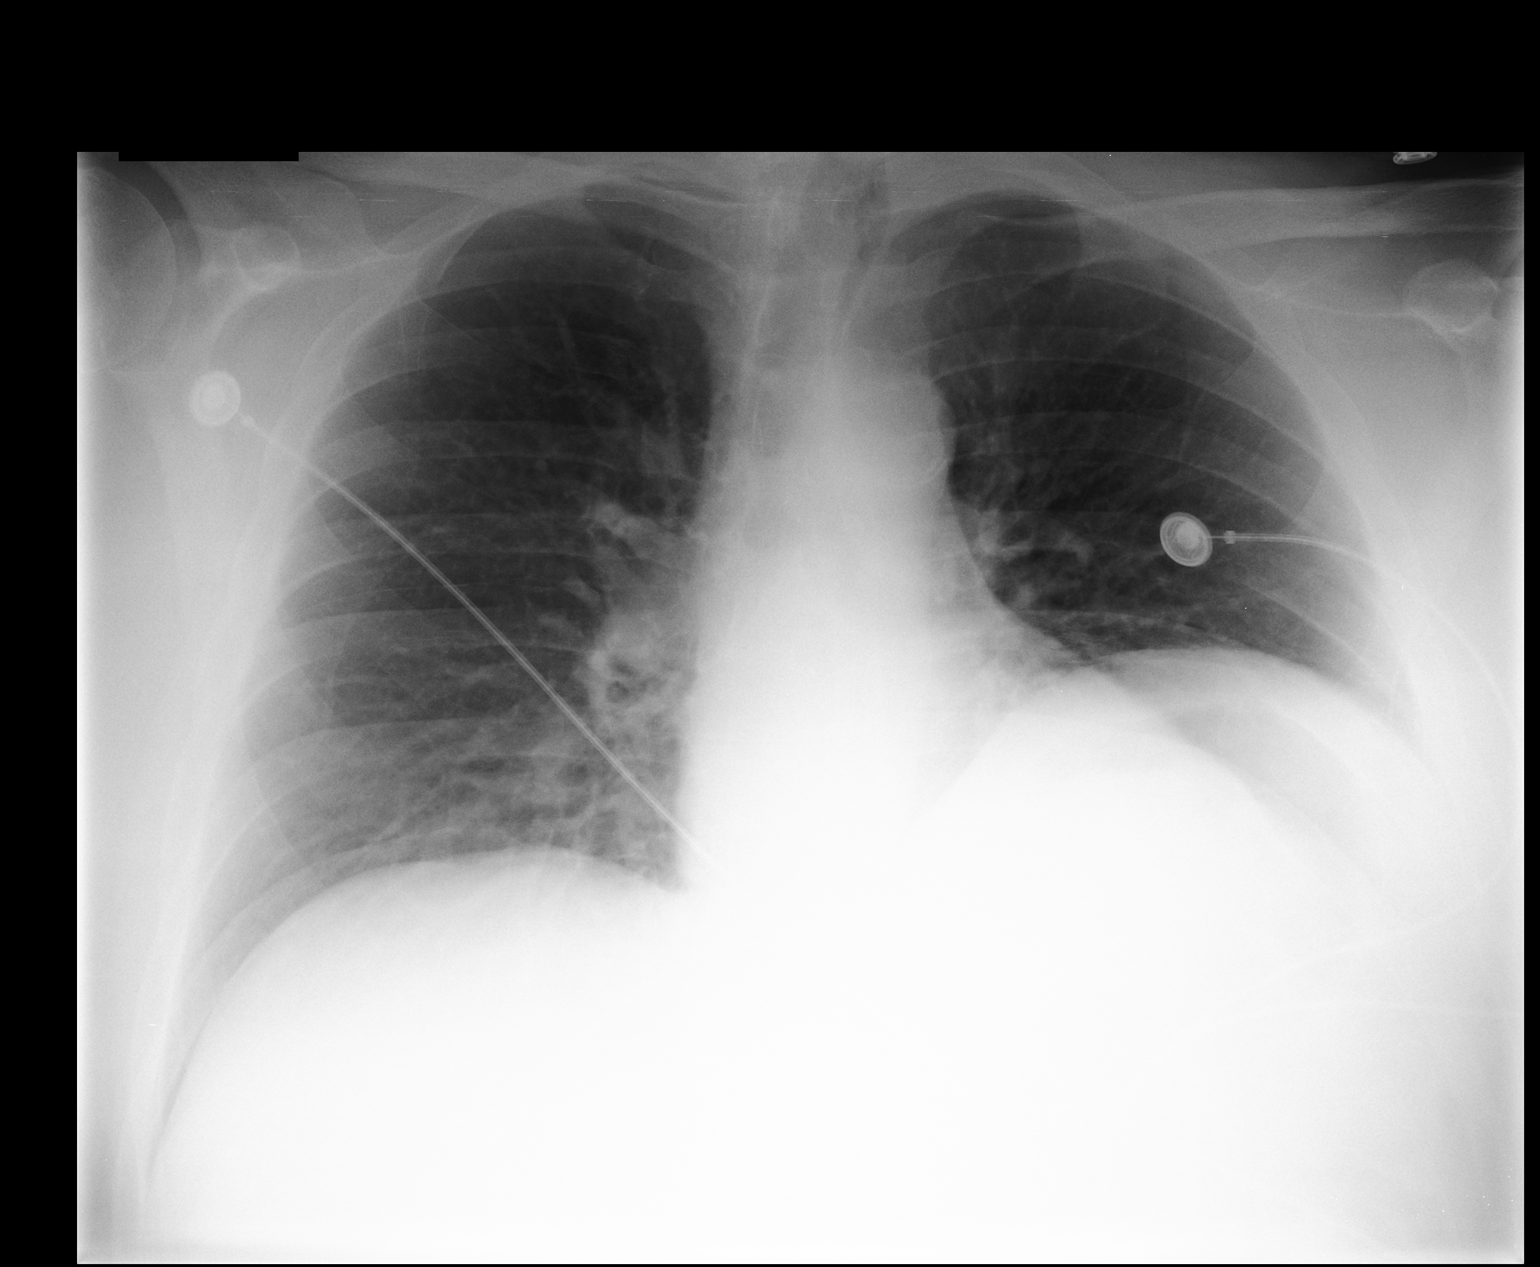

[1 of 1 positions shown; findings below may reference images not displayed]

FINDINGS: Single view of the chest without comparison shows streaky reticular airspace
opacities seen in the right lower lobe. There is no pleural effusion seen. The cardiac
silhouette is normal in size. There is no pulmonary vascular congestion seen. No
pneumothorax is seen. The visualized osseous structures show no acute findings.
IMPRESSION: 1. Streaky right lower lobe atelectasis versus infiltrate is seen, follow up recommended.
2. No pleural effusion.

Tech Notes:

[REDACTED]

## 2020-05-14 IMAGING — CR CHEST
1 series · 1 of 1 positions shown · non-contrast
Comparison: none

[chest port x-wise]
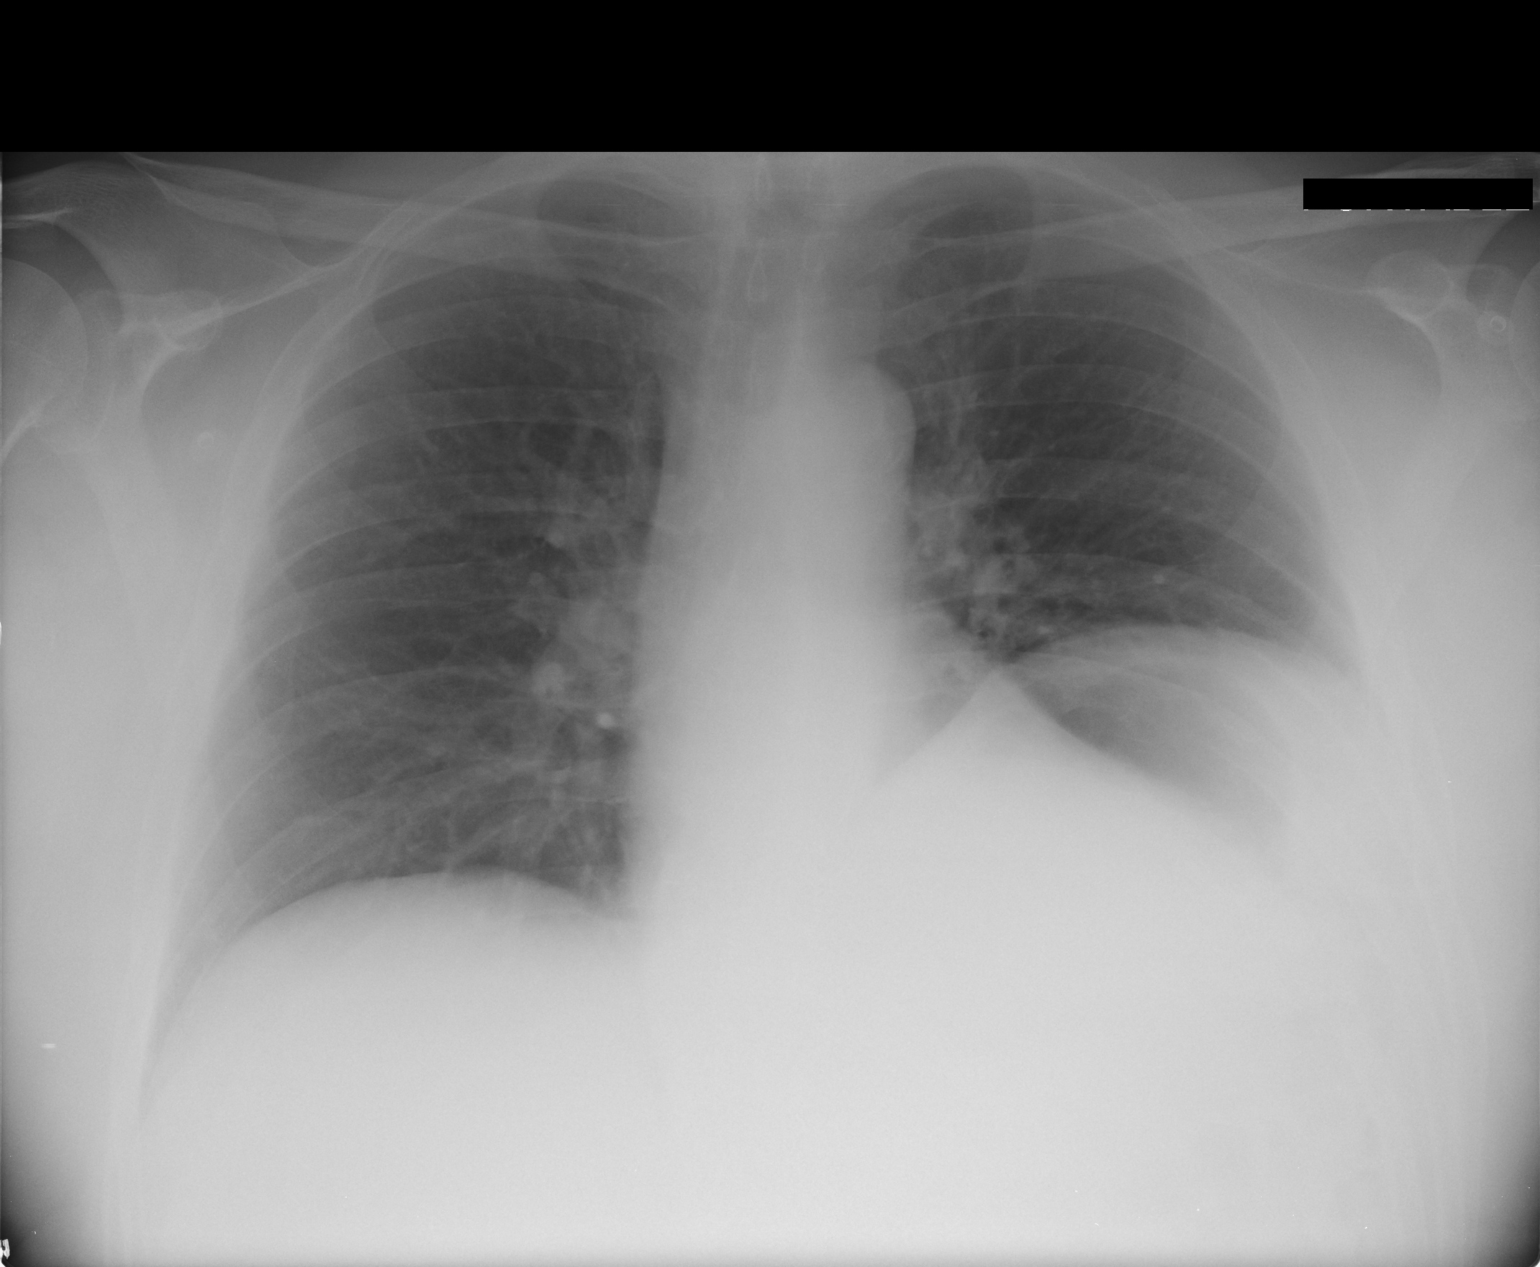

[1 of 1 positions shown; findings below may reference images not displayed]

EXAM

Portable chest x-ray.

INDICATION

atrial flutter
Pt c/o chest fluttering. Prior CXR 09/26/18. CC/CP

FINDINGS

The prior study was reviewed from 09/27/2018.

There is elevation left hemidiaphragm which appears unchanged.  There is gaseous distention of the
stomach.

There is mild cardiomegaly.

The lungs are otherwise clear.  The pulmonary vasculature is normal.

There are no acute bone lesions.

IMPRESSION

There is moderate elevation left hemidiaphragm which is unchanged.  This may be due to left phrenic
nerve dysfunction or eventration.  There is gaseous distention of the stomach.

There is mild cardiomegaly.  The lungs are clear.

Tech Notes:

Pt c/o chest fluttering. Prior CXR 09/26/18. CC/CP

## 2020-05-18 ENCOUNTER — Encounter: Admit: 2020-05-18 | Discharge: 2020-05-18 | Payer: MEDICARE

## 2020-05-18 MED ORDER — FLECAINIDE 50 MG PO TAB
50 mg | ORAL_TABLET | Freq: Two times a day (BID) | ORAL | 2 refills | 30.00000 days | Status: AC
Start: 2020-05-18 — End: ?

## 2020-05-18 MED ORDER — LOSARTAN 100 MG PO TAB
100 mg | ORAL_TABLET | Freq: Every day | ORAL | 3 refills | 30.00000 days | Status: AC
Start: 2020-05-18 — End: ?

## 2020-05-18 MED ORDER — ROSUVASTATIN 10 MG PO TAB
10 mg | ORAL_TABLET | Freq: Every day | ORAL | 3 refills | 90.00000 days | Status: AC
Start: 2020-05-18 — End: ?

## 2021-02-21 ENCOUNTER — Encounter: Admit: 2021-02-21 | Discharge: 2021-02-21 | Payer: MEDICARE

## 2021-02-21 MED ORDER — FLECAINIDE 50 MG PO TAB
50 mg | ORAL_TABLET | Freq: Two times a day (BID) | ORAL | 2 refills
Start: 2021-02-21 — End: ?

## 2021-04-23 ENCOUNTER — Encounter: Admit: 2021-04-23 | Discharge: 2021-04-23 | Payer: MEDICARE

## 2021-04-23 DIAGNOSIS — I4892 Unspecified atrial flutter: Secondary | ICD-10-CM

## 2021-04-23 DIAGNOSIS — I10 Essential (primary) hypertension: Secondary | ICD-10-CM

## 2021-04-23 MED ORDER — METOPROLOL SUCCINATE 100 MG PO TB24
ORAL_TABLET | Freq: Every evening | ORAL | 0 refills | 90.00000 days | Status: AC
Start: 2021-04-23 — End: ?

## 2021-05-23 ENCOUNTER — Encounter: Admit: 2021-05-23 | Discharge: 2021-05-23 | Payer: MEDICARE

## 2021-05-23 MED ORDER — ROSUVASTATIN 10 MG PO TAB
ORAL_TABLET | ORAL | 0 refills | 90.00000 days | Status: AC
Start: 2021-05-23 — End: ?

## 2021-05-23 MED ORDER — LOSARTAN 100 MG PO TAB
ORAL_TABLET | ORAL | 0 refills | 90.00000 days | Status: AC
Start: 2021-05-23 — End: ?

## 2021-07-02 ENCOUNTER — Ambulatory Visit: Admit: 2021-07-02 | Discharge: 2021-07-02 | Payer: MEDICARE

## 2021-07-02 ENCOUNTER — Encounter: Admit: 2021-07-02 | Discharge: 2021-07-02 | Payer: MEDICARE

## 2021-07-02 DIAGNOSIS — R739 Hyperglycemia, unspecified: Secondary | ICD-10-CM

## 2021-07-02 DIAGNOSIS — F410 Panic disorder [episodic paroxysmal anxiety] without agoraphobia: Secondary | ICD-10-CM

## 2021-07-02 DIAGNOSIS — I471 Supraventricular tachycardia: Secondary | ICD-10-CM

## 2021-07-02 DIAGNOSIS — Z9109 Other allergy status, other than to drugs and biological substances: Secondary | ICD-10-CM

## 2021-07-02 DIAGNOSIS — R002 Palpitations: Secondary | ICD-10-CM

## 2021-07-02 DIAGNOSIS — I10 Essential (primary) hypertension: Secondary | ICD-10-CM

## 2021-07-02 DIAGNOSIS — E119 Type 2 diabetes mellitus without complications: Secondary | ICD-10-CM

## 2021-07-02 DIAGNOSIS — E786 Lipoprotein deficiency: Secondary | ICD-10-CM

## 2021-07-02 DIAGNOSIS — Z0189 Encounter for other specified special examinations: Secondary | ICD-10-CM

## 2021-07-02 DIAGNOSIS — Z136 Encounter for screening for cardiovascular disorders: Secondary | ICD-10-CM

## 2021-07-02 DIAGNOSIS — G4730 Sleep apnea, unspecified: Secondary | ICD-10-CM

## 2021-07-02 DIAGNOSIS — I4892 Unspecified atrial flutter: Secondary | ICD-10-CM

## 2021-07-02 MED ORDER — METOPROLOL SUCCINATE 100 MG PO TB24
200 mg | ORAL_TABLET | Freq: Every evening | ORAL | 3 refills | 90.00000 days | Status: AC
Start: 2021-07-02 — End: ?

## 2021-07-02 NOTE — Progress Notes
Date of Service: 07/02/2021    Craig Krueger is a 66 y.o. male.       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  He rarely uses a single dose additional pill for palpitations.       Craig Krueger is back for routine follow-up.  He tells me his blood pressures been elevated quite a bit.  He brought numbers from home that show his blood pressure is staying to run nearly 100 at night sometimes.  He is currently on losartan 100 mg and metoprolol 150 mg nightly.  I think he would benefit from increasing his beta-blocker since his resting heart rates around 70-80 range and since still concerned about his arrhythmias.  He has not had any recurrence of palpitations or tachycardia since he had his AVNRT ablation.  He feels that that did very well.  He continues to take flecainide 50 mg twice daily with an extra dose as needed palpitations.  He has not had this for almost a year now.    He is not having chest pain, exertional chest pain, shortness of breath or shortness of breath PND orthopnea.  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  He has not had dizziness lightheadedness, syncope or near syncope.  My cardiovascular standpoint I think that he is doing well.    EKG demonstrates normal sinus rhythm and a normal EKG  Examination is normal.  Blood pressure is 132/86.  See details below    Impression  1. ?Paroxysmal atrial flutter. ?Controlled with flecainide and beta-blocker  2. ?Hypertension not fully controlled  We will increase metoprolol to 200 mg at bedtime.  3. ?History of AVNRT corrected with ablation  4. ?Possible sleep apnea  5. ?PACs and PVCs are benign  6. ?No underlying coronary disease  7. ?Hypercholesterolemia treated  ?  Blood pressure still not fully controlled.  Since his heart rate is still in the 70s will increase metoprolol to 1 mg bedtime continue with losartan and current dose of flecainide.  We will need to follow-up with a bit sooner than usual.  We will plan on having him follow-up in [place] office  ?       Vitals:    07/02/21 1557   BP: 132/86   BP Source: Arm, Left Upper   Pulse: 72   SpO2: 97%   PainSc: Zero   Weight: 107.5 kg (237 lb)   Height: 176.5 cm (5' 9.5)     Body mass index is 34.5 kg/m?Marland Kitchen     Past Medical History  Patient Active Problem List    Diagnosis Date Noted   ? Essential hypertension 05/06/2019   ? Atrial flutter (HCC) 10/14/2018   ? Maxillary sinusitis 09/14/2017   ? Hypercholesteremia 04/24/2011   ? Elevated blood sugar level 06/13/2010   ? Sleep apnea 04/20/2009     Mild no treatment necessary.  Sleep study - Occasional hypopneic episodes did not reach criteria for treatment. Suggested weight loss.     ? Heart palpitations 04/20/2009   ? SVT (supraventricular tachycardia) (HCC)      a.  09/1995 - RF ablation for AVNRT, successful.   b.  01/1999 - recurrent palpitations. Holter -  rare PACs and PVCs that correlated with symptoms.          Metoprolol initiated with improved symptoms.        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  ECG - compatible with AVNRT   Improved with increase in beta blocker - also helps panic attacks.   c.  08/2014 RF ablation for AVNRT - Dr Betti Cruz successful      ? Hypolipoproteinemia      A.  Pravastatin and lipitor caused myalgias, tolerated Crestor.      ? Tests ordered      1997 - exercise echo negative.   04/08 - exercise echo. 11 minutes treadmill. EKG normal. Echo normal.   02/11 exercise Echo normal.  Ef normal, valves normal. Excellent ex tolerance.  No arrhythmias  2014 regaden thallium, EF 88%, LVEDP 42 mL, no ischemia.  02/16 exercise echo.  No ischemia, EF 65%, valves and Doppler normal.  09/18 Regaden thallium: EF 23%, LV 43 mL, no ischemia, no change from 2014     ? Environmental allergies      A, Mold allergies.     ? Panic attacks          Review of Systems   Constitutional: Negative.   HENT: Negative.    Eyes: Negative.    Cardiovascular: Negative.    Respiratory: Negative.    Endocrine: Negative.    Hematologic/Lymphatic: Negative.    Skin: Negative.    Musculoskeletal: Negative.    Gastrointestinal: Negative.    Genitourinary: Negative.    Neurological: Negative.    Psychiatric/Behavioral: Negative.    Allergic/Immunologic: Negative.        Physical Exam   General Appearance: no acute distress  Skin: warm, moist, no ulcers  HEENT: unremarkable  Neck Veins: neck veins are flat, neck veins are not distended  Carotid Arteries: normal carotid upstroke bilaterally, no bruits  Chest Inspection: chest is normal in appearance  Auscultation/Percussion: lungs clear to auscultation, no rales, rhonchi, or wheezing  Cardiac Rhythm: regular rhythm and normal rate  Cardiac Auscultation: Normal S1 & S2, no S3 or S4, no rub  Murmurs: no cardiac murmurs   Extremities: no lower extremity edema; 2+ symmetric distal pulses  Abdominal Exam: soft, non-tender, no masses, bowel sounds normal  Liver & Spleen: no organomegaly  Neurologic Exam: neurological assessment grossly intact        Cardiovascular Health Factors  Vitals BP Readings from Last 3 Encounters:   07/02/21 132/86   05/06/19 130/88   12/20/18 128/84     Wt Readings from Last 3 Encounters:   07/02/21 107.5 kg (237 lb)   05/06/19 106.1 kg (234 lb)   12/20/18 102.1 kg (225 lb)     BMI Readings from Last 3 Encounters:   07/02/21 34.50 kg/m?   05/06/19 34.56 kg/m?   12/20/18 33.23 kg/m?      Smoking Social History     Tobacco Use   Smoking Status Never   Smokeless Tobacco Current   ? Types: Chew   Tobacco Comments    1/2 can/day      Lipid Profile Cholesterol   Date Value Ref Range Status   04/04/2021 158  Final     HDL   Date Value Ref Range Status   04/04/2021 60  Final LDL   Date Value Ref Range Status   04/04/2021 85  Final     Triglycerides   Date Value Ref Range Status   04/04/2021 67  Final  Blood Sugar Hemoglobin A1C   Date Value Ref Range Status   05/02/2011 5.9  Final     Glucose   Date Value Ref Range Status   04/04/2021 109 (H) 70 - 105 Final   04/03/2020 109 (H) 70 - 105 Final   10/07/2018 99  Final   10/15/2005 90 70 - 110 MG/DL Final          Current Medications (including today's revisions)  ? allopurinoL (ZYLOPRIM) 100 mg tablet Take three tablets by mouth daily.   ? ALPRAZolam (XANAX) 0.5 mg tablet Take one tablet by mouth three times daily as needed.   ? FEXOFENADINE HCL (ALLEGRA PO) Take 1 Tab by mouth daily.   ? flecainide (TAMBOCOR) 50 mg tablet TAKE ONE TABLET BY MOUTH TWICE DAILY   ? losartan (COZAAR) 100 mg tablet TAKE ONE TABLET BY MOUTH EVERY DAY   ? metoprolol succinate XL (TOPROL XL) 100 mg extended release tablet Take two tablets by mouth at bedtime daily.   ? rosuvastatin (CRESTOR) 10 mg tablet TAKE ONE TABLET BY MOUTH EVERY DAY   ? sulindac (CLINORIL) 150 mg tablet Take one tablet by mouth as Needed.

## 2021-08-17 ENCOUNTER — Encounter: Admit: 2021-08-17 | Discharge: 2021-08-17 | Payer: MEDICARE

## 2021-08-17 MED ORDER — ROSUVASTATIN 10 MG PO TAB
ORAL_TABLET | 0 refills
Start: 2021-08-17 — End: ?

## 2021-08-17 MED ORDER — LOSARTAN 100 MG PO TAB
ORAL_TABLET | 0 refills
Start: 2021-08-17 — End: ?

## 2021-11-14 ENCOUNTER — Encounter: Admit: 2021-11-14 | Discharge: 2021-11-14 | Payer: MEDICARE

## 2021-11-14 MED ORDER — FLECAINIDE 50 MG PO TAB
50 mg | ORAL_TABLET | Freq: Two times a day (BID) | ORAL | 2 refills | 30.00000 days | Status: AC
Start: 2021-11-14 — End: ?

## 2021-11-14 NOTE — Telephone Encounter
Received a request via computer from the patients pharmacy requesting a refill.  Script e-scribed as requested.

## 2022-01-29 ENCOUNTER — Encounter: Admit: 2022-01-29 | Discharge: 2022-01-29 | Payer: MEDICARE

## 2022-01-29 ENCOUNTER — Ambulatory Visit: Admit: 2022-01-29 | Discharge: 2022-01-30 | Payer: MEDICARE

## 2022-01-29 DIAGNOSIS — I471 SVT (supraventricular tachycardia): Secondary | ICD-10-CM

## 2022-01-29 DIAGNOSIS — G4730 Sleep apnea, unspecified: Secondary | ICD-10-CM

## 2022-01-29 DIAGNOSIS — E7800 Pure hypercholesterolemia, unspecified: Secondary | ICD-10-CM

## 2022-01-29 DIAGNOSIS — F410 Panic disorder [episodic paroxysmal anxiety] without agoraphobia: Secondary | ICD-10-CM

## 2022-01-29 DIAGNOSIS — Z9109 Other allergy status, other than to drugs and biological substances: Secondary | ICD-10-CM

## 2022-01-29 DIAGNOSIS — R002 Palpitations: Secondary | ICD-10-CM

## 2022-01-29 DIAGNOSIS — E119 Type 2 diabetes mellitus without complications: Secondary | ICD-10-CM

## 2022-01-29 DIAGNOSIS — E786 Lipoprotein deficiency: Secondary | ICD-10-CM

## 2022-01-29 DIAGNOSIS — I10 Essential (primary) hypertension: Secondary | ICD-10-CM

## 2022-01-29 DIAGNOSIS — R739 Hyperglycemia, unspecified: Secondary | ICD-10-CM

## 2022-01-29 DIAGNOSIS — Z0189 Encounter for other specified special examinations: Secondary | ICD-10-CM

## 2022-01-29 DIAGNOSIS — I4892 Unspecified atrial flutter: Secondary | ICD-10-CM

## 2022-01-29 NOTE — Progress Notes
Date of Service: 01/29/2022    Craig Krueger is a 66 y.o. male.          History of present illness: Craig Krueger is a very pleasant 66 y.o. male who is being seen at the [facility] Cardiovascular Medicine Department at the [facility] office.      Pertinent past medical history includes hypertension, dyslipidemia, previous statin induced myalgias, occasional palpitations due to isolated ectopy and previous AVNRT status post ablation in 2016 and likely atrial flutter.    He presents today in routine follow-up.  He is accompanied by his wife today.  He has no acute concerns or complaints today.  He notes he very rarely gets an occasional palpitation symptoms which he describes as a skipped beat sensation which only last for a few seconds at a time and happens at most a few times per month.  He does note he is typically limited by left knee pain, but otherwise he does not describe any exertional limitations such as chest discomfort, severe dyspnea, PND, orthopnea, syncope, dizziness, lightheadedness, lower extremity edema, TIA/CVA-like symptoms or other cardiovascular symptoms.  He is fully compliant with medical therapy.    Assessment:  ? Likely paroxysmal atrial flutter with no documented or symptomatic recurrence since 2020  ? History of AVNRT status post ablation in 2016  ? Hypertension  ? Rare but symptomatic premature atrial and ventricular contractions  ? Dyslipidemia  ? Relative intolerance to atorvastatin with myalgias.  ? Left knee arthritis    Plan:  We did discuss his atrial arrhythmia history.  He did have reported 1 episode of atrial flutter in 2020, with no symptomatic or documented recurrence.  He has been taking flecainide 50 mg twice daily and has been doing quite well from a symptom standpoint on this regimen.  Because his last ischemic evaluation was in 2016, we will need to ensure no underlying ischemia to ensure flecainide is safe to continue, therefore we will obtain a pharmacologic stress MPI.  With his knee pain, he will be unable to complete a treadmill study.  If this is nonischemic, it is reasonable to continue Toprol and flecainide.  We did discuss that his CHA2DS2-VASc score would technically be 2 for age and hypertension.  We will plan to obtain an routine ambulatory rhythm monitor, next with a 2-week Zio patch prior to his next appointment.  We may also need to consider placement of an ILR as we need to monitor him closely and he may require therapeutic anticoagulation for CVA risk reduction, for which he wishes to avoid at this time, therefore aspirin is reasonable for now.    His blood pressure looks optimal and we will continue losartan 100 mg daily and Toprol-XL 200 mg daily.    His lipid profile looks reasonable we will continue rosuvastatin 10 mg daily and he is tolerating this well.    We discussed ongoing lifestyle modifications.    Thank you for allowing [place] to care for this patient. If there are any questions or concerns, please don't hesitate to contact us.     We will see the patient back in return in in 9 month(s) time, or sooner if needed.     Alda Lea, DO, [facility]  Staff Cardiologist  Department of Cardiovascular Medicine  [facility]    Latest cardiovascular testing includes:  The 10-year ASCVD risk score (Arnett DK, et al., 2019) is: 12.1%*    Values used to calculate the  score:      Age: 76 years      Sex: Male      Is Non-Hispanic African American: No      Diabetic: No      Tobacco smoker: No      Systolic Blood Pressure: 120 mmHg      Is BP treated: Yes      HDL Cholesterol: 60 mg/dL*      Total Cholesterol: 158 mg/dL*      * - Cholesterol units were assumed for this score calculation    Most recent results for 12-Lead ECG   ECG 12-LEAD    Collection Time: 07/02/21  4:02 PM   Result Value Status    VENTRICULAR RATE 72 Final    P-R INTERVAL 196 Final    QRS DURATION 98 Final    Q-T INTERVAL 402 Final    QTC CALCULATION (BAZETT) 440 Final    P AXIS 36 Final    R AXIS -23 Final    T AXIS 51 Final    Impression    Normal sinus rhythm  Normal ECG  No previous ECGs available in MUSE  Confirmed by Doristine Counter, Dak ([id number]) on 07/02/2021 4:15:04 PM     Last Stress test: 05/15/2014:  Rest Echo:   Overall left ventricular systolic function is normal. EF~ 55%  No regional wall motion abnormalities identified   Valves appear structurally normal without signficant stenosis or regurgitation  No significant pericardial effusion  Normal ventricular chamber dimensions  Left atrial enlargement  Normal LV wall thickness  Normal diastolic function  Normal aortic root dimensions  Estimated peak systolic PA pressure =  30 mmHg   ??  Stress ECG  Normal rest baseline ECG  Fair exercise capacity  No symptoms of chest pain during exercise  Normal HR response to exercise  Hypertensive exercise response  ST segments remain isoelectric during stress  Several runs of non sustained  VT at peak exercise (3-6 beats)  Abnormal stress ECG with non sustained VT at peak exercise and hypertensive response  ?  Stress Echo:  EF improves with stress  LV function becomes hyperdynamic  LV volume decreases with stress.   No regional wall motion abnormalities post exercise.  ?  Exercise echocardiogram negative for ischemia.    Last echocardiogram: 10/13/2018:  1. Normal LV size and systolic function. LVEF 55-65%  2. Normal RV size and systolic function.   3. No hemodynamically significant valve disease.     Last Lipid profile and pertinent labs:   Lab Level  Lab Results   Component Value Date    CHOL 158 04/04/2021     Lab Results   Component Value Date    LDL 85 04/04/2021     No results found for: LIPOPROTA  Lab Results   Component Value Date    HDL 60 04/04/2021     No results found for: NONHDLCHOL  Lab Results   Component Value Date    TRIG 67 04/04/2021         Total time spent on today's office visit was >41 minutes including >50% of time time involved in counseling.  This includes face-to-face in person visit with patient as well as nonface-to-face time including review of the EMR, outside records, labs, radiologic studies, echocardiogram & other cardiovascular studies, formation of treatment plan, after visit summary, future disposition, and lastly on documentation.    Staff name:  Renato Shin, DO Date:  01/29/2022    Orders Placed This  Encounter   ? Zio Patch (7-14 days)   ? [facility]ADENOSON MPI STRESS TEST                  Vitals:    01/29/22 1340   BP: 120/76   BP Source: Arm, Right Upper   Pulse: 80   SpO2: 97%   O2 Device: None (Room air)   PainSc: Zero   Weight: 106.6 kg (235 lb)   Height: 176.5 cm (5' 9.5)     Body mass index is 34.21 kg/m?Marland Kitchen     Past Medical History  Patient Active Problem List    Diagnosis Date Noted   ? Essential hypertension 05/06/2019   ? Atrial flutter (HCC) 10/14/2018   ? Maxillary sinusitis 09/14/2017   ? Hypercholesteremia 04/24/2011   ? Elevated blood sugar level 06/13/2010   ? Sleep apnea 04/20/2009     Mild no treatment necessary.  Sleep study - Occasional hypopneic episodes did not reach criteria for treatment. Suggested weight loss.     ? Heart palpitations 04/20/2009   ? SVT (supraventricular tachycardia)      a.  09/1995 - RF ablation for AVNRT, successful.   b.  01/1999 - recurrent palpitations. Holter - rare PACs and PVCs that correlated with symptoms.          Metoprolol initiated with improved symptoms.        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  ECG - compatible with AVNRT   Improved with increase in beta blocker - also helps panic attacks.   c.  08/2014 RF ablation for AVNRT - Dr Betti Cruz successful      ? Hypolipoproteinemia      A.  Pravastatin and lipitor caused myalgias, tolerated Crestor.      ? Tests ordered      1997 - exercise echo negative.   04/08 - exercise echo. 11 minutes treadmill. EKG normal. Echo normal.   02/11 exercise Echo normal.  Ef normal, valves normal. Excellent ex tolerance.  No arrhythmias  2014 regaden thallium, EF 88%, LVEDP 42 mL, no ischemia.  02/16 exercise echo.  No ischemia, EF 65%, valves and Doppler normal.  09/18 Regaden thallium: EF 23%, LV 43 mL, no ischemia, no change from 2014     ? Environmental allergies      A, Mold allergies.     ? Panic attacks          Review of Systems   Constitutional: Negative.   HENT: Negative.    Eyes: Negative.    Cardiovascular: Negative.    Respiratory: Negative.    Endocrine: Negative.    Hematologic/Lymphatic: Negative.    Skin: Negative.    Musculoskeletal: Negative.    Gastrointestinal: Negative.    Genitourinary: Negative.    Neurological: Negative.    Psychiatric/Behavioral: Negative.    Allergic/Immunologic: Negative.        Physical Exam   Constitutional: He appears well-developed. No distress.   HENT:   Head: Normocephalic and atraumatic.   Mouth/Throat: Mucous membranes are moist.   Eyes: No scleral icterus.   Neck: No JVD present. Carotid bruit is not present.   Cardiovascular: Normal rate and regular rhythm.   No murmur heard.  Pulmonary/Chest: Effort normal and breath sounds normal. No respiratory distress. He has no wheezes.   Abdominal: Soft. Bowel sounds are normal. He exhibits no distension. There is no abdominal tenderness.   Musculoskeletal:      Right lower leg: No edema.  Left lower leg: No edema.   Neurological: He is alert and oriented to person, place, and time. Coordination normal.   Skin: Skin is warm and dry. He is not diaphoretic. No pallor.   Psychiatric: His behavior is normal.       Cardiovascular Health Factors  Vitals BP Readings from Last 3 Encounters:   01/29/22 120/76   07/02/21 132/86   05/06/19 130/88     Wt Readings from Last 3 Encounters:   01/29/22 106.6 kg (235 lb)   07/02/21 107.5 kg (237 lb)   05/06/19 106.1 kg (234 lb)     BMI Readings from Last 3 Encounters:   01/29/22 34.21 kg/m?   07/02/21 34.50 kg/m?   05/06/19 34.56 kg/m?      Smoking Social History     Tobacco Use   Smoking Status Never   Smokeless Tobacco Current   ? Types: Chew   Tobacco Comments    1/2 can/day      Lipid Profile Cholesterol   Date Value Ref Range Status   04/04/2021 158  Final     HDL   Date Value Ref Range Status   04/04/2021 60  Final     LDL   Date Value Ref Range Status   04/04/2021 85  Final     Triglycerides   Date Value Ref Range Status   04/04/2021 67  Final      Blood Sugar Hemoglobin A1C   Date Value Ref Range Status   05/02/2011 5.9  Final     Glucose   Date Value Ref Range Status   04/04/2021 109 (H) 70 - 105 Final   04/03/2020 109 (H) 70 - 105 Final   10/07/2018 99  Final   10/15/2005 90 70 - 110 MG/DL Final          Problems Addressed Today  Encounter Diagnoses   Name Primary?   ? Essential hypertension Yes   ? SVT (supraventricular tachycardia)    ? Hypercholesteremia    ? Atrial flutter, unspecified type (HCC)    ? Heart palpitations                    Current Medications (including today's revisions)  ? allopurinoL (ZYLOPRIM) 100 mg tablet Take three tablets by mouth daily.   ? ALPRAZolam (XANAX) 0.5 mg tablet Take one tablet by mouth three times daily as needed.   ? FEXOFENADINE HCL (ALLEGRA PO) Take 1 Tab by mouth daily.   ? flecainide (TAMBOCOR) 50 mg tablet TAKE ONE TABLET BY MOUTH TWICE DAILY   ? losartan (COZAAR) 100 mg tablet TAKE ONE TABLET BY MOUTH EVERY DAY   ? metoprolol succinate XL (TOPROL XL) 100 mg extended release tablet Take two tablets by mouth at bedtime daily.   ? rosuvastatin (CRESTOR) 10 mg tablet TAKE ONE TABLET BY MOUTH EVERY DAY   ? sulindac (CLINORIL) 150 mg tablet Take one tablet by mouth as Needed.

## 2022-01-29 NOTE — Patient Instructions
It was nice to see you in clinic today.    We discussed:  Your history of arrhythmias. For now it looks like your current medication regimen is controlling this well.   While on flecainide it is important to ensure blood flow to the heart is adequate.   We want to make sure you don't have other arrhythmias with periodic monitors as treatment can be different depending on what arrhythmia it is.   Your blood pressure and cholesterol looks well controlled.     I would like to make the following medication adjustments:  No Rx changes today.     Otherwise continue the same medications as you have been doing.    We will be pursuing the following tests after your appointment today:  Nuclear stress test (examines the blood flow to the heart muscle)  Heart rhythm monitor.   These tests can be done before your next visit.     I will plan to see you back in about 9 months.  Please call us in the meantime with any questions or concerns.    If there are any questions or concerns, our main line is [phone]. Our scheduling department can be reached at [phone].     [place] clinic: Call the [place] nursing line at [phone].  Leave a detailed message for the nurse in [place] with how we can assist you and we will call you back.     If you have not heard or been advised of MyChart results of your testing in more than 1 week after it has been performed, please give [place] a call so that we may investigate further.    Lifestyle Modification recommendations:  Please continue to take your prescribed medications at the scheduled times on a regular basis.    Regular aerobic and weight bearing exercise is recommended at least 5 days a week for at least 30 minutes with a goal to exercise for 60 minutes. Ideally, exercise should be done daily if possible.     Eat a heart healthy, mediterranean style diet, focusing on fresh fruit, vegetables, nuts, legumes and avoidance of red or processed meat.   Limit the salt in your diet to less than 2,400 mg daily and calories in your diet.   Avoid high saturated fat/high cholesterol foods.  Increase soluble fiber intake as tolerated.     Limit alcohol intake to 1 drink daily for women and 2 drinks daily for men (1 drink = 5 ounces of wine, 12 ounces of beer, or 1 ounce of liquor).    Try to maintain an ideal body weight.    Do not smoke and avoid environments where people are smoking.    The American Heart Association has a great website with lots of important patient information about heart disease and other medical conditions including: high blood pressure, cholesterol disorders, diabetes mellitus, and strokes.  I highly recommend you visit their site: www.heart.org    Follow up regularly with your primary care physician.    [facility]    Nuclear Stress Test Instructions    Your cardiologist has asked that you have a nuclear stress test (also known as a Myocardial Perfusion Imaging (MPI) test.    This evaluation of your heart muscle consists of two sets of nuclear images and either a Treadmill stress test or a chemical stress test, decided by you and your physician.    You will get an IV placed in your arm for  the test.    You will need to be able to raise your arm up by your head for about 20 minutes and lie on your back for about 10 minutes.  Please discuss this with your doctor or talk with the nuclear technologist or nurse if these are a problem for you.    It is recommended not to schedule any other appointment for the same day.      Wear comfortable clothing and walking shoes if you are walking on the treadmill.  Women should wear shorts or comfortable pants instead of dresses.   Sweatshirts or T-shirts work really well for imaging.    If you have a Zio Patch cardiac monitor in place, please reschedule your nuclear stress test after your monitoring period is complete. If you would like to proceed with the nuclear stress test during the monitoring time, the patch will have to be removed and the data will be lost; we can place a new patch following testing.       If you have a cardiac monitor with replacement patches, please inform the nurse prior to your appointment.  The monitor will need to be removed during testing. Please bring replacement patches with you so that we can resume monitoring following your test.     There may be enough time to leave to get a snack after the stress portion of the test.   The Technologist will tell you what time to return for the second set of images.    PLEASE NO CAFFEINE 24 HOURS PRIOR TO TEST:  Examples include coffee, tea, decaffeinated drinks, colas, [facility], Dr. Reino Kent.  Some orange sodas and root beers have caffeine, please check.  No energy drinks, Excedrin, Midol, or any foods CONTAINING CHOCOLATE.   Consuming Caffeine may postpone your test.    PLEASE DO NOT EAT OR DRINK THE MORNING OF YOUR TEST.  Water is ok to drink with your morning medications.    Please hold these medications the day of test:      DIABETIC PATIENTS:  if insulin dependent:  please take one third of your insulin with two pieces of dry toast and a small juice.  Bring remaining 2/3   insulin and oral diabetic medications with you to your test.    PLEASE NO TOBACCO PRODUCTS BETWEEN SCANS  You will NOT need a driver for this test.  But are welcome to bring a visitor with you.   Visitors will not be able to accompany you back to stress room.   Please do not bring children to the nuclear stress test.    TEST FINDINGS:   You will receive the results of the test within 7 business days of completion of this test by telephone. If you have any questions concerning your nuclear stress test or if you do not hear from your cardiologist/or nurse with 7 business days, please call our office.

## 2022-02-13 ENCOUNTER — Encounter: Admit: 2022-02-13 | Discharge: 2022-02-13 | Payer: MEDICARE

## 2022-02-13 MED ORDER — ROSUVASTATIN 10 MG PO TAB
ORAL_TABLET | ORAL | 1 refills | 90.00000 days | Status: AC
Start: 2022-02-13 — End: ?

## 2022-02-13 MED ORDER — LOSARTAN 100 MG PO TAB
ORAL_TABLET | ORAL | 1 refills | 90.00000 days | Status: AC
Start: 2022-02-13 — End: ?

## 2022-06-12 ENCOUNTER — Encounter: Admit: 2022-06-12 | Discharge: 2022-06-12 | Payer: MEDICARE

## 2022-06-12 DIAGNOSIS — I10 Essential (primary) hypertension: Secondary | ICD-10-CM

## 2022-06-12 DIAGNOSIS — I4892 Unspecified atrial flutter: Secondary | ICD-10-CM

## 2022-06-12 MED ORDER — METOPROLOL SUCCINATE 100 MG PO TB24
ORAL_TABLET | ORAL | 3 refills | 90.00000 days | Status: AC
Start: 2022-06-12 — End: ?

## 2022-06-12 NOTE — Telephone Encounter
Received a request via computer from the patients pharmacy requesting a refill.  Script e-scribed as requested.

## 2022-06-18 ENCOUNTER — Ambulatory Visit: Admit: 2022-06-18 | Discharge: 2022-06-18 | Payer: MEDICARE

## 2022-06-18 ENCOUNTER — Encounter: Admit: 2022-06-18 | Discharge: 2022-06-18 | Payer: MEDICARE

## 2022-06-18 DIAGNOSIS — I471 SVT (supraventricular tachycardia) (HCC): Secondary | ICD-10-CM

## 2022-06-18 DIAGNOSIS — R002 Palpitations: Secondary | ICD-10-CM

## 2022-06-18 DIAGNOSIS — E7800 Pure hypercholesterolemia, unspecified: Secondary | ICD-10-CM

## 2022-06-18 DIAGNOSIS — I10 Essential (primary) hypertension: Secondary | ICD-10-CM

## 2022-06-18 DIAGNOSIS — I4892 Unspecified atrial flutter: Secondary | ICD-10-CM

## 2022-06-18 MED ORDER — RP DX TC-99M TETROFOSMIN MCI
28 | Freq: Once | INTRAVENOUS | 0 refills | Status: CP
Start: 2022-06-18 — End: ?

## 2022-06-18 MED ORDER — REGADENOSON 0.4 MG/5 ML IV SYRG
.4 mg | Freq: Once | INTRAVENOUS | 0 refills | Status: CP
Start: 2022-06-18 — End: ?

## 2022-06-18 MED ORDER — AMINOPHYLLINE 25 MG/ML IV SOLN
50 mg | INTRAVENOUS | 0 refills | Status: AC | PRN
Start: 2022-06-18 — End: ?

## 2022-06-18 MED ORDER — ALBUTEROL SULFATE 90 MCG/ACTUATION IN HFAA
2 | RESPIRATORY_TRACT | 0 refills | Status: AC | PRN
Start: 2022-06-18 — End: ?

## 2022-06-18 MED ORDER — SODIUM CHLORIDE 0.9 % IV SOLP
250 mL | INTRAVENOUS | 0 refills | Status: AC | PRN
Start: 2022-06-18 — End: ?

## 2022-06-18 MED ORDER — RP DX TC-99M TETROFOSMIN MCI
10 | Freq: Once | INTRAVENOUS | 0 refills | Status: CP
Start: 2022-06-18 — End: ?

## 2022-06-18 MED ORDER — EUCALYPTUS-MENTHOL MM LOZG
1 | Freq: Once | ORAL | 0 refills | Status: AC | PRN
Start: 2022-06-18 — End: ?

## 2022-06-18 MED ORDER — NITROGLYCERIN 0.4 MG SL SUBL
.4 mg | SUBLINGUAL | 0 refills | Status: AC | PRN
Start: 2022-06-18 — End: ?

## 2022-06-20 ENCOUNTER — Encounter: Admit: 2022-06-20 | Discharge: 2022-06-20 | Payer: MEDICARE

## 2022-06-20 NOTE — Telephone Encounter
Results and recommendations called to patient.

## 2022-06-20 NOTE — Telephone Encounter
-----   Message from Renato Shin, DO sent at 06/19/2022  5:22 PM CDT -----  Can we let the patient know that I reviewed his stress test.  Overall it was good news and reassuring.  The heart function is normal and no significant blockages or ischemia were noted.  He is safe to continue taking his current medication regimen.  No new changes today.

## 2022-07-30 ENCOUNTER — Encounter: Admit: 2022-07-30 | Discharge: 2022-07-30 | Payer: MEDICARE

## 2022-07-30 ENCOUNTER — Ambulatory Visit: Admit: 2022-07-30 | Discharge: 2022-07-30 | Payer: MEDICARE

## 2022-07-30 DIAGNOSIS — I10 Essential (primary) hypertension: Secondary | ICD-10-CM

## 2022-07-30 DIAGNOSIS — R002 Palpitations: Secondary | ICD-10-CM

## 2022-07-30 DIAGNOSIS — I471 SVT (supraventricular tachycardia) (HCC): Secondary | ICD-10-CM

## 2022-07-30 DIAGNOSIS — E7800 Pure hypercholesterolemia, unspecified: Secondary | ICD-10-CM

## 2022-07-30 DIAGNOSIS — I4892 Unspecified atrial flutter: Secondary | ICD-10-CM

## 2022-07-30 NOTE — Progress Notes
To our valued patient,     We have enrolled your heart monitor and requested it be sent to your home.  You should receive this within 2-3 business days. Please wear the monitor for 14 days. When you have completed the study, please remove the device, and mail it back to the company. Please call [employer] Customer Service at [phone] with questions about placement, troubleshooting, and insurance coverage. You can reach the [facility] ambulatory heart monitor team at [phone].      Your Heart Rhythm Management Team  Cardiovascular Medicine Department at [facility]              Ambulatory (External) Cardiac Monitor Enrollment Record     Placement Location: Home Enrollment  Clinic Location: [facility]  No data recordedNo data recordedNo data recordedMonitor Diagnosis: -- (I48.92)  Secondary Monitor Diagnosis: Palpitations (R00.2)  Ordering Provider: Renato Shin, DO  AMB Monitor Serial Number: home  No data recorded    Start Time and Date: 07/30/22 7:10 AM   Patient Name: Craig Krueger  DOB: [birth year]  MRN: [id number]  Sex: male  Mobile Phone Number: [phone] (mobile)  Home Phone Number: [phone]  Patient Address: [address] [place] [address]  Insurance Coverage: MEDICARE PART A AND B  Insurance ID: [id number]  Insurance Group #:   Insurance Subscriber: [name],[name]  Implanted Cardiac Device Information: No results found for: [id number]      Patient instructed to contact company phone number on the monitor box with questions regarding billing, placement, troubleshooting.     [employer] [employer] Elih Mooney    ____________________________________________________________    Clinic Staff:    Complete additional steps for documentation double check/Co-Sign.  In Follow-up, send chart upon closing encounter to [facility] AMBULATORY MONITORS    [facility] Ambulatory Monitoring Team:  Schedule on appropriate template and check-in.   Clinic Placement Schedule on clinic location [facility] schedule   Home Enrollment Schedule on Home Enrollment schedule ([facility])   Given to patient in clinic for self-placement Schedule on Home Enrollment schedule ([facility])   Inpatient Schedule on [place] [facility] AMBULATORY MONITORING template   2. Please enroll with appropriate vendor.

## 2022-09-01 ENCOUNTER — Encounter: Admit: 2022-09-01 | Discharge: 2022-09-01 | Payer: MEDICARE

## 2022-09-01 NOTE — Telephone Encounter
-----   Message from Agustin Cree, DO sent at 08/29/2022  5:42 PM CDT -----  Can we let the patient know that I reviewed his monitor.  Overall looks good and his heart rhythm was normal throughout.  We can discuss this further at his upcoming clinic visit.  No new changes today.

## 2022-11-19 ENCOUNTER — Encounter: Admit: 2022-11-19 | Discharge: 2022-11-19 | Payer: MEDICARE

## 2022-11-19 ENCOUNTER — Ambulatory Visit: Admit: 2022-11-19 | Discharge: 2022-11-20 | Payer: MEDICARE

## 2022-11-19 DIAGNOSIS — F410 Panic disorder [episodic paroxysmal anxiety] without agoraphobia: Secondary | ICD-10-CM

## 2022-11-19 DIAGNOSIS — I4892 Unspecified atrial flutter: Secondary | ICD-10-CM

## 2022-11-19 DIAGNOSIS — E119 Type 2 diabetes mellitus without complications: Secondary | ICD-10-CM

## 2022-11-19 DIAGNOSIS — R002 Palpitations: Secondary | ICD-10-CM

## 2022-11-19 DIAGNOSIS — Z0189 Encounter for other specified special examinations: Secondary | ICD-10-CM

## 2022-11-19 DIAGNOSIS — E7800 Pure hypercholesterolemia, unspecified: Secondary | ICD-10-CM

## 2022-11-19 DIAGNOSIS — R739 Hyperglycemia, unspecified: Secondary | ICD-10-CM

## 2022-11-19 DIAGNOSIS — G4730 Sleep apnea, unspecified: Secondary | ICD-10-CM

## 2022-11-19 DIAGNOSIS — I471 SVT (supraventricular tachycardia) (HCC): Secondary | ICD-10-CM

## 2022-11-19 DIAGNOSIS — E786 Lipoprotein deficiency: Secondary | ICD-10-CM

## 2022-11-19 DIAGNOSIS — I10 Essential (primary) hypertension: Secondary | ICD-10-CM

## 2022-11-19 DIAGNOSIS — Z9109 Other allergy status, other than to drugs and biological substances: Secondary | ICD-10-CM

## 2022-11-19 NOTE — Patient Instructions
Atrial Fibrillation ACTION Plan    **Please consider taking a picture of this and keeping in your phone or keep a copy in your wallet/purse**    For most patients with history of atrial fibrillation, episodes of atrial fibrillation are not a medical emergency as long as the risk for stroke has been assessed and, if indicated, appropriate protection is provided (triage, anticoagulation, rate control). Your provider would like you to review the following instructions for various aspects of management if you have an episode of atrial fibrillation (triage, anticoagulation, heart rate control, and heart rhythm control).    PART I: TRIAGE    RED LIGHT -- Please go directly to the ED or call 911 if you have...  Symptoms of a possible stroke: Difficulty speaking, loss of vision, change in ability to balance yourself, numbness/tingling/weakness limited to one side of the body  An episode of passing out  At least 15 minutes and ongoing new onset shortness of breath that prevents you from being able to get around where you live   At least 15 minutes chest pain/pressure at rest (particularly if it is persisting)    YELLOW LIGHT -- Please contact your physician by Mychart or call during business hours if you have...  Episodes of atrial fibrillation lasting more than 1 hour or increasing in duration, frequency, or severity.  A new symptom with or concern about atrial fibrillation that you have questions about.    GREEN LIGHT -- Please contact your physician by Mychart or call  during business hours if you have...  You can continue with your current management plan as long as episodes last less than 1 hour and you do not have any ?red light? symptoms  Please keep a log of the date, time, duration, and heart rate during your episodes of atrial fibrillation if known.     PART II: ANTICOAGULATION (BLOOD THINNER) AND STROKE RISK    If you have an episode of atrial fibrillation your provider would like you to: Start a blood thinner if AF persists >48 hours. Please contact the clinic if you have AF lasting for more than 1 hour so we can send a prescription.     PART III: HEART RATE CONTROL    If you have an episode of atrial fibrillation lasting more than 1 hour your provider would like you to: Continue your current heart rate control medications.    PART IV: HEART RHYTHM CONTROL    If you have an episode of atrial fibrillation lasting more than 30 minutes your provider would like you to: Take an extra 100mg  of flecainide in addition to the normal dose you take. You can take a second 100mg  dose in the evening if you are still in AF in addition to your normal 50mg  dose. Do not exceed 300mg  of flecainide in a 24hour period.       It was nice to see you in clinic today.    We discussed:  Your blood pressure which is elevated today. Please check it once to twice daily and write it down. We will call in a few weeks to see how it is doing.   Your heart rhythm has been stable. You are at an increased risk for blood clots. At some point we may want to consider implanting a loop recorder which is a small chip which follows your heart rhythm for up to 5 years.     I would like to make the following medication adjustments:  No Rx changes  today.     Otherwise continue the same medications as you have been doing.    We will be pursuing the following tests after your appointment today:  Labs to include blood count, chemistry, A1c, fasting lipid level and thyroid in 6 months.     I will plan to see you back in about 6 months.  Please call us in the meantime with any questions or concerns.    If there are any questions or concerns, our main line is [phone]. Our scheduling department can be reached at [phone].     To get a hold of the [employer], call the [place] nursing line at [phone].  Leave a detailed message for the nurses in [place] with how we can assist you and we will call you back.       If you have not heard or been advised of MyChart results of your testing in more than 1 week after it has been performed, please give [place] a call so that we may investigate further.    Lifestyle Modification recommendations:  Please continue to take your prescribed medications at the scheduled times on a regular basis.    Regular aerobic and weight bearing exercise is recommended at least 5 days a week for at least 30 minutes with a goal to exercise for 60 minutes. Ideally, exercise should be done daily if possible.     Eat a heart healthy, mediterranean style diet, focusing on fresh fruit, vegetables, nuts, legumes and avoidance of red or processed meat.   Limit the salt in your diet to less than 2,400 mg daily and calories in your diet.   Avoid high saturated fat/high cholesterol foods.  Increase soluble fiber intake as tolerated.     Limit alcohol intake to 1 drink daily for women and 2 drinks daily for men (1 drink = 5 ounces of wine, 12 ounces of beer, or 1 ounce of liquor).    Try to maintain an ideal body weight.    Do not smoke and avoid environments where people are smoking.    The American Heart Association has a great website with lots of important patient information about heart disease and other medical conditions including: high blood pressure, cholesterol disorders, diabetes mellitus, and strokes.  I highly recommend you visit their site: www.heart.org    Follow up regularly with your primary care physician.

## 2022-11-19 NOTE — Progress Notes
Date of Service: 11/19/2022    Craig Krueger is a 67 y.o. male.          History of present illness: Craig Krueger is a very pleasant 66 y.o. male who is being seen at the [facility] Cardiovascular Medicine Department at the [facility] office.      Pertinent past medical history includes hypertension, dyslipidemia, previous statin induced myalgias, occasional palpitations due to isolated ectopy and previous AVNRT status post ablation in 2016 and likely atrial flutter.      He is here today for a routine follow-up after a series of tests including a stress test, heart rhythm monitor, and blood work, all of which showed satisfactory results. The patient reports occasional heart rhythm irregularities, describing them as skips, with rare instances of multiple skips in a row. These episodes, however, have not escalated into sustained tachycardia as he used to. The patient is currently on flecainide and metoprolol to manage the heart rhythm.    The patient expresses reluctance to start blood thinners, citing a preference to minimize medication intake. The patient acknowledges the inherent risk of blood clots and stroke due to atrial fibrillation and flutter, but prefers to manage the risk with current medications and lifestyle.    The patient also reports a recent cold or allergy-like symptoms, which may have affected sleep patterns and potentially elevated blood pressure. The patient has been taking Aleve daily for chronic knee and back pain, which could also contribute to the elevated blood pressure.     The patient has a history of knee issues, which have been exacerbated by a previous surgical intervention. The knee pain has been managed with over-the-counter NSAIDs, specifically Aleve, taken daily. The patient also takes a daily aspirin.    Assessment:  Paroxysmal atrial flutter with no documented or symptomatic recurrence since 2020 (single episode).   History of AVNRT status post ablation in 2016  Hypertension  Rare but symptomatic premature atrial and ventricular contractions  Dyslipidemia  Intolerance to atorvastatin with myalgias.  Left knee arthritis    Plan:    Atrial arrhythmia: Rhythm has been stable on Flecainide and Metoprolol. No recent episodes of arrhythmia noted since 2020.  Discussed the need for regular ischemic evaluations while on Flecainide and the potential need for anticoagulation due to stroke risk. Patient currently not in favor of anticoagulation.  CHA2DS2-VASc score is 2 for age and hypertension  -Continue Flecainide and Metoprolol.  -Consider implantable loop recorder for better monitoring of heart rhythm.  He wishes to consider this and will let us know if he is agreeable for implantation.  Atrial arrhythmia action plan was provided to the patient.  We discussed if he does have repeat episode (he reports being markedly symptomatic), we would initiate DOAC anticoagulation as soon as this is noted.    Hypertension: Blood pressure elevated at today's visit, potentially due to recent NSAID use and illness. Patient to monitor blood pressure at home for the next 2 weeks.  His blood pressure has historically been well-controlled.  -Reduce NSAID use.  -Monitor blood pressure at home daily for the next 2-3 weeks.  -Call patient in 2-3 weeks to review blood pressure readings and adjust medication regimen as needed.  Consider transition to olmesartan/HCTZ combination in place of losartan, and/or the addition of amlodipine.  Patient wants to minimize medication burden as able.    Hyperlipidemia  Well controlled on Rosuvastatin. Continue Rosuvastatin.    We discussed ongoing lifestyle modifications.  Thank you for allowing [place] to care for this patient. If there are any questions or concerns, please don't hesitate to contact us.     We will see the patient back in return in in 6 month(s) time (with labs and EKG), or sooner if needed.     Alda Lea, DO, [facility]  Staff Cardiologist  Department of Cardiovascular Medicine  [facility]    Latest cardiovascular testing includes:  The 10-year ASCVD risk score (Arnett DK, et al., 2019) is: 25.9%*    Values used to calculate the score:      Age: 64 years      Sex: Male      Is Non-Hispanic African American: No      Diabetic: No      Tobacco smoker: No      Systolic Blood Pressure: 172 mmHg      Is BP treated: Yes      HDL Cholesterol: 47 mg/dL*      Total Cholesterol: 152 mg/dL*      * - Cholesterol units were assumed for this score calculation    Most recent results for 12-Lead ECG   ECG 12-LEAD    Collection Time: 07/02/21  4:02 PM   Result Value Status    VENTRICULAR RATE 72 Final    P-R INTERVAL 196 Final    QRS DURATION 98 Final    Q-T INTERVAL 402 Final    QTC CALCULATION (BAZETT) 440 Final    P AXIS 36 Final    R AXIS -23 Final    T AXIS 51 Final    Impression    Normal sinus rhythm  Normal ECG  No previous ECGs available in MUSE  Confirmed by Doristine Counter, Dak ([id number]) on 07/02/2021 4:15:04 PM     Last Stress test: 06/18/2022:  Left Ventricular Ejection Fraction (post stress, in the resting state) =  67 %.    Left Ventricular End Diastolic Volume: 29.51 mL    SUMMARY/OPINION:  This study is probably normal.  The perfusion pattern is most consistent with soft tissue attenuation.  A significant ischemic change is not appreciated.  Left ventricular systolic function is normal, the ejection fraction is 67%. There are no high risk prognostic indicators present.  The pulmonary uptake of tracer appeared normal, there is no transient ischemic dilatation.  The pharmacologic ECG portion of the study is negative for ischemia.     There are no prior studies available for comparison.      In aggregate the current study is low risk in regards to predicted annual cardiovascular mortality rate.      Last echocardiogram: 10/13/2018:  Normal LV size and systolic function. LVEF 55-65%  Normal RV size and systolic function.   No hemodynamically significant valve disease.     Last Lipid profile and pertinent labs:   Lab Level  Lab Results   Component Value Date    CHOL 152 04/07/2022     Lab Results   Component Value Date    LDL 90 04/07/2022     No results found for: LIPOPROTA  Lab Results   Component Value Date    HDL 47 04/07/2022     No results found for: NONHDLCHOL  Lab Results   Component Value Date    TRIG 76 04/07/2022         Total time spent on today's office visit was >41 minutes including >50% of time time involved in counseling.  This includes face-to-face in person  visit with patient as well as nonface-to-face time including review of the EMR, outside records, labs, radiologic studies, echocardiogram & other cardiovascular studies, formation of treatment plan, after visit summary, future disposition, and lastly on documentation.    Staff name:  Nolon Bussing Jacoba Cherney, DO Date:  11/19/2022    Orders Placed This Encounter    CBC AND DIFF    COMPREHENSIVE METABOLIC PANEL    HEMOGLOBIN A1C    LIPID PROFILE    TSH WITH FREE T4 REFLEX                    Vitals:    11/19/22 1022 11/19/22 1031   BP: (!) 172/105 (!) 172/104   BP Source: Arm, Left Upper Arm, Left Upper   Pulse: 68    SpO2: 95%    O2 Device: None (Room air)    PainSc: Zero    Weight: 108.8 kg (239 lb 12.8 oz)    Height: 176.5 cm (5' 9.5)      Body mass index is 34.9 kg/m?Marland Kitchen     Past Medical History  Patient Active Problem List    Diagnosis Date Noted    Essential hypertension 05/06/2019    Atrial flutter (HCC) 10/14/2018    Maxillary sinusitis 09/14/2017    Hypercholesteremia 04/24/2011    Elevated blood sugar level 06/13/2010    Sleep apnea 04/20/2009     Mild no treatment necessary.  Sleep study - Occasional hypopneic episodes did not reach criteria for treatment. Suggested weight loss.      Heart palpitations 04/20/2009    SVT (supraventricular tachycardia) (HCC)      a.  09/1995 - RF ablation for AVNRT, successful.   b.  01/1999 - recurrent palpitations. Holter - rare PACs and PVCs that correlated with symptoms.          Metoprolol initiated with improved symptoms.        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  ECG - compatible with AVNRT   Improved with increase in beta blocker - also helps panic attacks.   c.  08/2014 RF ablation for AVNRT - Dr Betti Cruz successful       Hypolipoproteinemia      A.  Pravastatin and lipitor caused myalgias, tolerated Crestor.       Tests ordered      1997 - exercise echo negative.   04/08 - exercise echo. 11 minutes treadmill. EKG normal. Echo normal.   02/11 exercise Echo normal.  Ef normal, valves normal. Excellent ex tolerance.  No arrhythmias  2014 regaden thallium, EF 88%, LVEDP 42 mL, no ischemia.  02/16 exercise echo.  No ischemia, EF 65%, valves and Doppler normal.  09/18 Regaden thallium: EF 23%, LV 43 mL, no ischemia, no change from 2014      Environmental allergies      A, Mold allergies.      Panic attacks          Review of Systems   Constitutional: Negative.   HENT:  Positive for congestion.    Eyes: Negative.    Cardiovascular: Negative.    Respiratory: Negative.     Endocrine: Negative.    Hematologic/Lymphatic: Negative.    Skin: Negative.    Musculoskeletal: Negative.    Gastrointestinal: Negative.    Genitourinary: Negative.    Neurological: Negative.    Psychiatric/Behavioral: Negative.     Allergic/Immunologic: Negative.        Physical Exam   Constitutional: He appears well-developed. No distress.  HENT:   Head: Normocephalic and atraumatic. Mouth/Throat: Mucous membranes are moist.   Eyes: No scleral icterus.   Neck: No JVD present. Carotid bruit is not present.   Cardiovascular: Normal rate and regular rhythm.   No murmur heard.  Pulmonary/Chest: Effort normal and breath sounds normal. No respiratory distress. He has no wheezes.   Abdominal: Soft. Bowel sounds are normal. He exhibits no distension. There is no abdominal tenderness.   Musculoskeletal:      Right lower leg: No edema.      Left lower leg: No edema. Neurological: He is alert and oriented to person, place, and time. Coordination normal.   Skin: Skin is warm and dry. He is not diaphoretic. No pallor.   Psychiatric: His behavior is normal.       Cardiovascular Health Factors  Vitals BP Readings from Last 3 Encounters:   11/19/22 (!) 172/104   01/29/22 120/76   07/02/21 132/86     Wt Readings from Last 3 Encounters:   11/19/22 108.8 kg (239 lb 12.8 oz)   01/29/22 106.6 kg (235 lb)   07/02/21 107.5 kg (237 lb)     BMI Readings from Last 3 Encounters:   11/19/22 34.90 kg/m?   01/29/22 34.21 kg/m?   07/02/21 34.50 kg/m?      Smoking Social History     Tobacco Use   Smoking Status Never   Smokeless Tobacco Current    Types: Chew   Tobacco Comments    1/2 can/day      Lipid Profile Cholesterol   Date Value Ref Range Status   04/07/2022 152  Final     HDL   Date Value Ref Range Status   04/07/2022 47  Final     LDL   Date Value Ref Range Status   04/07/2022 90  Final     Triglycerides   Date Value Ref Range Status   04/07/2022 76  Final      Blood Sugar Hemoglobin A1C   Date Value Ref Range Status   05/02/2011 5.9  Final     Glucose   Date Value Ref Range Status   04/07/2022 109 (H) 70 - 105 Final   04/04/2021 109 (H) 70 - 105 Final   04/03/2020 109 (H) 70 - 105 Final   10/15/2005 90 70 - 110 MG/DL Final          Problems Addressed Today  Encounter Diagnoses   Name Primary?    SVT (supraventricular tachycardia) (HCC) Yes    Hypolipoproteinemia     Essential hypertension     Atrial flutter, unspecified type (HCC)     Hypercholesteremia     Elevated blood sugar level     Heart palpitations                      Current Medications (including today's revisions)   allopurinoL (ZYLOPRIM) 100 mg tablet Take three tablets by mouth daily.    ALPRAZolam (XANAX) 0.5 mg tablet Take one tablet by mouth three times daily as needed.    FEXOFENADINE HCL (ALLEGRA PO) Take 1 Tab by mouth daily.    flecainide (TAMBOCOR) 50 mg tablet TAKE ONE TABLET BY MOUTH TWICE DAILY    losartan (COZAAR) 100 mg tablet TAKE ONE TABLET BY MOUTH EVERY DAY    metoprolol succinate XL (TOPROL XL) 100 mg extended release tablet TAKE TWO TABLETS BY MOUTH EVERY NIGHT AT BEDTIME    rosuvastatin (CRESTOR) 10 mg tablet TAKE ONE TABLET  BY MOUTH EVERY DAY    sulindac (CLINORIL) 150 mg tablet Take one tablet by mouth as Needed.

## 2023-04-09 ENCOUNTER — Encounter: Admit: 2023-04-09 | Discharge: 2023-04-09 | Payer: MEDICARE

## 2023-04-29 ENCOUNTER — Encounter: Admit: 2023-04-29 | Discharge: 2023-04-29 | Payer: MEDICARE

## 2023-04-30 ENCOUNTER — Encounter: Admit: 2023-04-30 | Discharge: 2023-04-30 | Payer: MEDICARE

## 2023-04-30 MED ORDER — FLECAINIDE 50 MG PO TAB
50 mg | ORAL_TABLET | Freq: Two times a day (BID) | ORAL | 2 refills | 30.00000 days | Status: AC
Start: 2023-04-30 — End: ?

## 2023-06-02 ENCOUNTER — Encounter: Admit: 2023-06-02 | Discharge: 2023-06-02 | Payer: MEDICARE

## 2023-06-02 DIAGNOSIS — I10 Essential (primary) hypertension: Secondary | ICD-10-CM

## 2023-06-02 DIAGNOSIS — I4892 Unspecified atrial flutter: Secondary | ICD-10-CM

## 2023-06-02 MED ORDER — METOPROLOL SUCCINATE 100 MG PO TB24
200 mg | ORAL_TABLET | Freq: Every evening | ORAL | 3 refills | 90.00000 days | Status: AC
Start: 2023-06-02 — End: ?

## 2023-08-03 ENCOUNTER — Encounter: Admit: 2023-08-03 | Discharge: 2023-08-03 | Payer: MEDICARE

## 2023-08-03 MED ORDER — LOSARTAN 100 MG PO TAB
100 mg | ORAL_TABLET | Freq: Every day | ORAL | 3 refills | 90.00000 days | Status: AC
Start: 2023-08-03 — End: ?

## 2023-08-03 MED ORDER — ROSUVASTATIN 10 MG PO TAB
10 mg | ORAL_TABLET | Freq: Every day | ORAL | 3 refills | 90.00000 days | Status: AC
Start: 2023-08-03 — End: ?

## 2024-01-29 ENCOUNTER — Encounter: Admit: 2024-01-29 | Discharge: 2024-01-29 | Payer: MEDICARE

## 2024-03-25 ENCOUNTER — Encounter: Admit: 2024-03-25 | Discharge: 2024-03-25 | Payer: MEDICARE

## 2024-03-25 DIAGNOSIS — E7800 Pure hypercholesterolemia, unspecified: Secondary | ICD-10-CM

## 2024-03-25 DIAGNOSIS — I10 Essential (primary) hypertension: Secondary | ICD-10-CM

## 2024-03-25 DIAGNOSIS — E786 Lipoprotein deficiency: Principal | ICD-10-CM

## 2024-04-14 ENCOUNTER — Encounter: Admit: 2024-04-14 | Discharge: 2024-04-14 | Payer: MEDICARE
